# Patient Record
Sex: Male | Born: 1990 | Race: Black or African American | Hispanic: No | Marital: Single | Smoking: Current every day smoker
Health system: Southern US, Community
[De-identification: ages and names within clinical notes are randomized; demographics above are authoritative.]

## PROBLEM LIST (undated history)

## (undated) DIAGNOSIS — F319 Bipolar disorder, unspecified: Secondary | ICD-10-CM

## (undated) DIAGNOSIS — F209 Schizophrenia, unspecified: Secondary | ICD-10-CM

---
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
## 2011-03-25 NOTE — Discharge Summary (Signed)
Alexander Kidd, Kidd NO.:  [id number]   MEDICAL RECORD NO.:  [id number]                   PATIENT TYPE:  INP   LOCATION:  0601                                 FACILITY:  [facility]   PHYSICIAN:  Alexander Milch, MD                  DATE OF BIRTH:  [birth year]   DATE OF ADMISSION:  11/20/2003  DATE OF DISCHARGE:  11/25/2003                                 DISCHARGE SUMMARY   IDENTIFICATION:  A [age]-year-old male, 6th grade student at [employer] was admitted involuntarily on an emergency mental health  petition by [facility] for inpatient stabilization of suicide risk,  depression, and discontrolled behavior.  The patient was referred as well by  [facility], [facility].   SYNOPSIS OF PRESENT ILLNESS:  The patient has no previous mental health care  but has a 1-2 year history of cumulative dissatisfaction and disappointment  with himself, his life and his future, but particularly his family.  The  patient has wanted the parents to reunite after 7 years of separation.  He  continues to see father every weekend and mother is overwhelmed with the  patient's aggressive despair.  The parents were domestically violent toward  each other, with father more than mother, and father has had substance abuse  with alcohol according to mother.  The patient apparently had to live with  paternal grandmother for 4 years to attend the best school and she now has  colon cancer and has had a heart attack.  Maternal grandmother died 2 years  ago of throat cancer.  The patient is perfectionistic in his expectations of  self and others and wants to a professional football player.  He describes  identity diffusion and confusion, particularly relative to identifying with  father, but disapproving of parents' separation.  The patient has  orthodontic braces.  He cannot play football again until next fall.  Mother  considers the patient is  failing at school and has a Advertising account planner.  A  peer had blamed the patient for something that resulted in in-school  suspension as the trigger for his current decompensation with the patient  planning to kill himself and having 2 occasions of suicide attempt in the  last year, one running in front of a moving vehicle and the other cutting  himself.  He wrote a note, drew a picture and stated he was going to hang  himself at this time and would run away to do so.   INITIAL MENTAL STATUS EXAM:  The patient was significantly obsessive and  narcissistically defended.  He had atypical depressive symptoms and  features, with dysthymic dissatisfaction and disappointment and lack of  fulfillment in his life, himself and his future.  He had no psychotic or  manic symptoms.  He had no definite anxiety but had significant impulse  control difficulties.  He was moderately  avoidant and inattentive initially,  with fidgeting hyperactivity, though both of these may be associated with  his agitation and depression.  No definite BHD was present clinically.   LABORATORY FINDINGS:  CBC on admission was normal, except white count low at  4100, reference range 4800-12,000, and MCV was borderline low at 77.6 with  reference range 78-92.  Hemoglobin was normal at 12.5 and platelet count  267,000.  Basic metabolic panel was normal with sodium 136, potassium 4.3,  glucose 85, creatinine 0.8, and calcium 10.1.  Hepatic function panel was  normal with total bilirubin 0.7, AST 22, ALT 21, and albumin 3.6 with GGT  normal at 9.  Free T4 was borderline low at 0.88 with reference range 0.89-  1.8 and TSH was normal at 1.852.  Urinalysis was normal with specific  gravity of 1.027.   Kidd COURSE AND TREATMENT:  General medical exam by [facility],  PA-C noted no allergies and the patient reported home was great and he had  friends and school had been good.  He had well-healed scars on the left  forearm  from football.  He was considered possibly overweight and his  admission height was 64.75 inches with weight of 164 pounds.  Blood pressure  was 154/77 with heart rate of 93 sitting on admission and standing blood  pressure 124/70 with heart rate of 112.  His final weight in the Kidd  the day before discharge was 160 pounds and his final blood pressure was  101/55 with heart rate of 75 supine and standing blood pressure was 108/59  with heart rate of 107.  The patient's symptoms was initially denied and  clinically repressed and suppressed by the patient until on the second  Kidd day he was stating he could not stand it anymore.  He had a  difficult time processing and identifying problems and then problem solving.  It was necessary to progressively work through these symptoms and the  obstacles to therapeutic change and recovery.  Dysphoria was most important  to contain in order to facilitate the patient's ability to work through  these symptoms in a stepwise fashion.  Impulse control difficulties were  clearly present in this process and satiation in working through for habit  reversal was carried out multiple times during the Kidd stay.  The  patient did not manifest other habits or rituals.  He did not have  perseveration or stereotypies.  Effexor became important toward mobilizing  the patient's improved capacity to work on impulse control and anger  outbursts, as well as cumulative despair and perfectionistic dysphoria,  without continually regressing into rageful self destruction and acting out.  The patient started Effexor XR at 37.5 mg daily and advanced to 75 mg XR  daily.  He required one p.r.n. dose of Vistaril 50 mg during his Kidd  stay.  The patient started to make progress, particularly with the family  therapy session the day before discharge.  He had another family therapy  session on the afternoon of discharge and was capable along with parents  of establishing capacity for transition of care to outpatient treatment and  patient's return home.  He was discharged in improved condition though with  need for ongoing treatment.  However his medication was stable and he and  family were communicating well and working diligently upon more safe and  successful problem solving, impulse control and anger dissipation.  The  patient indicated by the time of discharge that he was accepting  that his  parents were never going to reunite.  He was motivated for outpatient  treatment as were both parents, and he was discharged in improved condition.  He had no suicide-related side effects by the time of discharge and no other  side effects from Effexor, including no gastroesophageal side effects.   FINAL DIAGNOSES:  AXIS 1:  1. Dysthymic disorder, early onset, severe, with atypical features.  2. 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  3. Rule out impulse control disorder not otherwise specified (provisional     diagnosis).  4. Parent-child problem.  5. Other interpersonal problem.  6. Other specified family circumstances.  AXIS II:  Rule out learning disorder not otherwise specified (provisional diagnosis).  AXIS III:  1. Orthodontic braces.  2. Borderline microcytosis and borderline leukopenia probably nutritional.  3. Borderline low Free T4 with normal TSH, likely physiologic stress.  4. Borderline overweight likely muscular.  AXIS IV:  Stressors:  Family - severe, acute and chronic; school - moderate, acute and  chronic.  AXIS V:  Global assessment of function on admission 35 with highest in the last year  74 and discharge global assessment of function was 57.   PLAN:  The patient manifested a quick stabilization and worked through his  obstacles to family problem solving to establish the capacity to transition  to outpatient treatment.  He is prescribed Effexor XR 75 mg every morning,  quantity #30 with 1 refill  and he and family are educated on the side  effects, risks, and proper use, as well as FDA guidelines on monitoring for  the 1/50 chance suicide-related side effects might occur in a person less  than age 78  taking any antidepressant for any reason.  The patient will be seen at  [facility], with an appointment scheduled with Alexander K[facility] for November 27, 2003 at 0830.  Crisis and safety plans established if  needed.                                               Alexander Milch, MD    GJ/MEDQ  D:  11/25/2003  T:  11/26/2003  Job:  [id number]   cc:   [facility]  [address]  [place], [place] [address]  Fax:  [phone]

## 2011-03-25 NOTE — H&P (Signed)
Alexander Kidd, LUCKEN NO.:  [id number]   MEDICAL RECORD NO.:  [id number]                   PATIENT TYPE:  INP   LOCATION:  0601                                 FACILITY:  [facility]   PHYSICIAN:  Beverly Milch, MD                  DATE OF BIRTH:  [birth year]   DATE OF ADMISSION:  11/20/2003  DATE OF DISCHARGE:                         PSYCHIATRIC ADMISSION ASSESSMENT   IDENTIFICATION:  A 20 year old male, 6th grade student at [employer], is admitted involuntarily on an emergency mental health  petition by [facility] for inpatient stabilization of suicide risk,  depression and angry, discontrolled behavior.  Mother is afraid the patient  will kill himself and the patient is referred by Tretha Sciara, [phone] or  [phone], with [facility].   HISTORY OF PRESENT ILLNESS:  The patient reports that he has had no previous  mental health treatment, including no inpatient care.  He is on no  medications and has had no previous psychotropic medications.  The patient  minimizes his symptoms in a defensive fashion but will gradually start to  open up and state that he had a lot building for a long time and just lost  control of it as it all came out at once.  However, he required assistance  in structuring the communication of his inter psychic feelings and thoughts,  rather than being able to spontaneously do so himself.  Mother is concerned  that the patient has attempted to harm himself or kill himself in the past  on at least 2 occasions over the last year, including cutting himself and  running out in front of a moving vehicle once.  The patient now has written  a note, drawn a picture and stated he was going to hang himself.  He  indicated he would run away in order to do so successfully.  The school and  mother have been significantly concerned in that regard.  The patient denies  that he has any depression.  He will  acknowledge that he is somewhat  perfectionistic in regard to his expectations of family, school, and self  and describes himself as having a self centered attitude like his father.  Parents separated apparently 7 years ago and the patient sees father every  weekend.  Mother is overwhelmed attempting to manage the patient's behavior  and emotions now.  Apparently parents were domestically violent toward each  other, with father being more so than mother.  Father had problems with  substance abuse with alcohol according to mother.  The patient apparently  had to live with paternal grandmother for 4 years to attend the best school.  Paternal grandmother now has colon cancer and has had a heart attack, and  maternal grandmother died 2 years ago of throat cancer.  The patient is  hypersensitive to the comments or actions of others and seems to have  significant rejection sensitivity.  He therefore presents atypical  depressive feathers that mother suggests have lasted for a full year but the  patient suggests are not present.  The patient suggests that he just has  some anger and some build up of negative emotion which he considers  appropriate for the course of events he has experienced in life.  Father  tends to be devaluing of others and the patient exhibits the same, according  to the patient and mother.  The patient is having conflicts and difficulties  at school according to mother.  The patient received in-school suspension  and thinks this was unfair as he was being blamed by another peer at school  for something he did not do according to the patient.  Mother notes that the  patient is essentially failing at school now and may be in a resource room.  He is having difficulty at school in several levels in that regard.  They do  not know of any definite learning disability or attention deficit  hyperactivity disorder.  The patient is resistant to consideration or even  discussion of  medications and will need help reviewing such, particularly as  mother is feeling that there is an emergent need to do something to help him  and he is not the best candidate for therapy currently.  The patient denies  the use of alcohol, illicit drugs, or tobacco.  He denies that he has had  any legal infractions and he does not meet criteria for definite  oppositional-defiant disorder though such may be evolving.  He describes  identity diffusion and confusion, particularly relative to parents'  separation and the patient's identification with father but living with  mother.   PAST MEDICAL HISTORY:  The patient has measles and chickenpox in early  childhood.  He had a PPD test 2 years ago.  He has apparently been under the  care of Dr. Penelope Galas in the recent past.  He had orthodontic braces.  He has  no medication allergies and is on no medications.  He has no seizure or  syncope.  He has had no heart murmur or arrythmia.  He has had no organic  central nervous system trauma.  Immunizations are up to date.   FAMILY HISTORY:  Father had substance abuse with alcohol and both parents  have been domestically abusive, father more so than mother.  Maternal  grandmother died of cancer of the throat 2 years ago.  Paternal grandmother  now has colon cancer and had a coronary event.  The patient's mother and  sister have asthma.  The patient has 2 sisters and one half sister.   SOCIAL AND DEVELOPMENTAL HISTORY:  The patient is in the 6th grade  currently, but mother states he is failing at [employer].  He  may have a Advertising account planner.  A peer apparently blamed him, which the  patient considered unfair and inappropriate and the patient now has in-  school suspension.  The patient responded to in-school suspension in a  perfectionistic and narcissistic fashion by planning to hang himself.  The  patient states he just lost control of his emotions that had been building up in a  negative way for a long time.   ASSETS:  The patient is intelligent and driven but unsuccessfully so.   MENTAL STATUS EXAM:  Height is 63.75 inches and weight is 164 pounds.  Blood  pressure is 154/77 with heart rate of 93 supine and standing blood  pressure  is 124/70 with heart rate of 112.  The patient is right handed.  Neurological exam is intact.  He has no pathological reflexes or soft  neurologic findings.  He has no abnormal involuntary movements.  Gait and  gaze are intact.  Cranial nerves intact.  Muscle strength and tone are  normal.  He is alert and oriented with speech intact.  AMRs and DTRs are  intact.  The patient is highly obsessive and narcissistically defended.  He  will not acknowledge his negative affect an atypical depressive symptoms  until these are formulated and displayed for him.  He has cumulative inter  psychic dissatisfaction, disappointment and lack of fulfillment with  himself, his life and his future, but even more so with his family.  He  apparently still has a need for parents to be reunited and father to be in  his life more frequently.  He has no definite anxiety though he does have  impulse control difficulties.  He seems moderately inattentive or avoidant  at this time.  He has fidgeting hyperactivity but this may be associated  with his agitation and his mood and his cumulative anger.  He does not  manifest psychosis or mania.  He does have suicidal ideation and has  written, drawn, and described this to others.   IMPRESSION:  AXIS 1:  1. Depressive disorder not otherwise specified with atypical features, most     consistent with dysthymic disorder.  2. 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  3. Rule out attention deficit hyperactivity disorder (provisional     diagnosis).  4. Parent-child problem.  5. Other interpersonal problem.  6. Other specified family circumstances.  AXIS II:  Rule out learning disorder not otherwise  specified (provisional diagnosis).  AXIS III:  Orthodontic braces.  AXIS IV:  Stressors:  Family - severe, acute and chronic; school - moderate, acute and  chronic.  AXIS V:  Global assessment of function on admission 35 with highest in last year 74.   PLAN:  The patient is admitted for inpatient adolescent psychiatric and  multi-disciplinary, multi-modal behavioral health treatment in a team-based  program in a locked psychiatric unit.  Anger management, cognitive  behavioral therapy, and family therapy are planned.  Mobilization of  cumulative stressors for establishing the capacity of coping with these in  an effective, sequential way instead of having them build up to the point of  decompensation is planned.  Effexor is recommended if willing but the  patient is unwilling to consider pharmacotherapy at this time.  Teaching  will be provided for him and parents in case he becomes willing or in case  family can help him with this decision, especially mother. Estimated length of stay is 5-7 days with  target symptoms for discharge being stabilization of suicide risk and mood,  stabilization of anger and impulse control and  generalization of the  capacity for safe and effective consolidation of identity and participation  in outpatient treatment in a compliant fashion.                                               Beverly Milch, MD    GJ/MEDQ  D:  11/21/2003  T:  11/21/2003  Job:  [id number]

## 2011-08-13 ENCOUNTER — Emergency Department (HOSPITAL_COMMUNITY)
Admission: EM | Admit: 2011-08-13 | Discharge: 2011-08-13 | Discharge status: Against Medical Advice | Payer: Self-pay | Attending: Emergency Medicine | Admitting: Emergency Medicine

## 2011-08-13 DIAGNOSIS — R51 Headache: Secondary | ICD-10-CM | POA: Insufficient documentation

## 2011-08-13 DIAGNOSIS — R509 Fever, unspecified: Secondary | ICD-10-CM | POA: Insufficient documentation

## 2011-08-13 DIAGNOSIS — J351 Hypertrophy of tonsils: Secondary | ICD-10-CM | POA: Insufficient documentation

## 2011-08-13 DIAGNOSIS — J029 Acute pharyngitis, unspecified: Secondary | ICD-10-CM | POA: Insufficient documentation

## 2011-08-13 DIAGNOSIS — H5710 Ocular pain, unspecified eye: Secondary | ICD-10-CM | POA: Insufficient documentation

## 2011-08-13 LAB — RAPID STREP SCREEN (MED CTR MEBANE ONLY): Streptococcus, Group A Screen (Direct): POSITIVE — AB

## 2011-11-18 ENCOUNTER — Other Ambulatory Visit: Payer: Self-pay

## 2011-11-18 ENCOUNTER — Encounter (HOSPITAL_COMMUNITY): Payer: Self-pay

## 2011-11-18 ENCOUNTER — Emergency Department (HOSPITAL_COMMUNITY)
Admission: EM | Admit: 2011-11-18 | Discharge: 2011-11-18 | Discharge status: Against Medical Advice | Payer: Self-pay | Attending: Emergency Medicine | Admitting: Emergency Medicine

## 2011-11-18 ENCOUNTER — Emergency Department (HOSPITAL_COMMUNITY): Payer: Self-pay

## 2011-11-18 DIAGNOSIS — R059 Cough, unspecified: Secondary | ICD-10-CM | POA: Insufficient documentation

## 2011-11-18 DIAGNOSIS — F17200 Nicotine dependence, unspecified, uncomplicated: Secondary | ICD-10-CM | POA: Insufficient documentation

## 2011-11-18 DIAGNOSIS — R197 Diarrhea, unspecified: Secondary | ICD-10-CM | POA: Insufficient documentation

## 2011-11-18 DIAGNOSIS — R509 Fever, unspecified: Secondary | ICD-10-CM | POA: Insufficient documentation

## 2011-11-18 DIAGNOSIS — R05 Cough: Secondary | ICD-10-CM | POA: Insufficient documentation

## 2011-11-18 DIAGNOSIS — F319 Bipolar disorder, unspecified: Secondary | ICD-10-CM | POA: Insufficient documentation

## 2011-11-18 DIAGNOSIS — Z8659 Personal history of other mental and behavioral disorders: Secondary | ICD-10-CM | POA: Insufficient documentation

## 2011-11-18 DIAGNOSIS — R51 Headache: Secondary | ICD-10-CM | POA: Insufficient documentation

## 2011-11-18 HISTORY — DX: Bipolar disorder, unspecified: F31.9

## 2011-11-18 HISTORY — DX: Schizophrenia, unspecified: F20.9

## 2011-11-18 LAB — POCT I-STAT, CHEM 8
BUN: 11 mg/dL (ref 6–23)
Calcium, Ion: 1.14 mmol/L (ref 1.12–1.32)
TCO2: 26 mmol/L (ref 0–100)

## 2011-11-18 LAB — POCT I-STAT TROPONIN I: Troponin i, poc: 0.01 ng/mL (ref 0.00–0.08)

## 2011-11-18 MED ORDER — DIPHENHYDRAMINE HCL 50 MG/ML IJ SOLN
25.0000 mg | Freq: Once | INTRAMUSCULAR | Status: AC
  Administered 2011-11-18: 25 mg via INTRAVENOUS
  Filled 2011-11-18: qty 1

## 2011-11-18 MED ORDER — DEXAMETHASONE SODIUM PHOSPHATE 10 MG/ML IJ SOLN
10.0000 mg | Freq: Once | INTRAMUSCULAR | Status: AC
  Administered 2011-11-18: 10 mg via INTRAVENOUS
  Filled 2011-11-18: qty 1

## 2011-11-18 MED ORDER — METOCLOPRAMIDE HCL 5 MG/ML IJ SOLN
10.0000 mg | Freq: Once | INTRAMUSCULAR | Status: AC
  Administered 2011-11-18: 10 mg via INTRAVENOUS
  Filled 2011-11-18: qty 2

## 2011-11-18 MED ORDER — SODIUM CHLORIDE 0.9 % IV BOLUS (SEPSIS)
1000.0000 mL | Freq: Once | INTRAVENOUS | Status: AC
  Administered 2011-11-18: 1000 mL via INTRAVENOUS

## 2011-11-18 MED ORDER — KETOROLAC TROMETHAMINE 30 MG/ML IJ SOLN
30.0000 mg | Freq: Once | INTRAMUSCULAR | Status: AC
  Administered 2011-11-18: 30 mg via INTRAVENOUS
  Filled 2011-11-18: qty 1

## 2011-11-18 NOTE — ED Notes (Signed)
Explained to patient the anticipated time for blood results to be resulted. Patient states "I'm ready to go. I been here too damn long. Take this IV out." Schorr, FNP notified.

## 2011-11-18 NOTE — ED Notes (Signed)
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  Has vague sx's x last 6 months; "feels like heart will pop out of chest, shob, h/a"  Pt's fiance states she was lying on his chest about a month ago listening to his heart beat stating that it stopped beating x a few seconds, thumped his chest and he woke up.  Pt says when he lies on his LT side that he is shob.  Also c/o LT hand pain x few months.

## 2011-11-18 NOTE — ED Notes (Signed)
Bed:WA15<BR> Expected date:<BR> Expected time:<BR> Means of arrival:<BR> Comments:<BR> Triage 1

## 2011-11-18 NOTE — ED Notes (Signed)
Patient transported to X-ray 

## 2011-11-18 NOTE — ED Notes (Signed)
Patient talking loudly in the room. Left ED treatment area ambulatory. Significant other remains at bedside awaiting results.

## 2011-11-19 NOTE — ED Provider Notes (Signed)
Medical screening examination/treatment/procedure(s) were performed by non-physician practitioner and as supervising physician I was immediately available for consultation/collaboration.   Nat Christen, MD 11/19/11 1345

## 2011-11-19 NOTE — ED Provider Notes (Signed)
History     CSN: [id number]  Arrival date & time 11/18/11  2044   First MD Initiated Contact with Patient 11/18/11 2137      Chief Complaint  Patient presents with  . Cough  . Fever  . Headache     Patient is a 21 y.o. male presenting with flu symptoms.  Influenza This is a new problem. The current episode started in the past 7 days. Associated symptoms include a change in bowel habit, chest pain, chills, congestion, headaches and myalgias. Pertinent negatives include no abdominal pain, coughing, neck pain, vomiting or weakness. The symptoms are aggravated by sneezing. He has tried nothing for the symptoms.  Pt reports multiple somewhat vague complaints. States he has had (L) rib area pain x 6 months w/ intermittent SOB and CP for same period of time. Denies injury. More recently pt reports 2-3 day h/o diarrhea (2 -3 episodes a day), sneezing, bodyaches, and chills. States symptoms have been associated w/ a severe headache.  Past Medical History  Diagnosis Date  . Bipolar disorder   . Schizophrenia     History reviewed. No pertinent past surgical history.  History reviewed. No pertinent family history.  History  Substance Use Topics  . Smoking status: Current Everyday Smoker -- 1.0 packs/day  . Smokeless tobacco: Not on file  . Alcohol Use: Yes     occasionally      Review of Systems  Constitutional: Positive for chills.  HENT: Positive for congestion. Negative for neck pain.   Eyes: Negative.   Respiratory: Negative.  Negative for cough.   Cardiovascular: Positive for chest pain.  Gastrointestinal: Positive for change in bowel habit. Negative for vomiting and abdominal pain.  Genitourinary: Negative.   Musculoskeletal: Positive for myalgias.  Skin: Negative.   Neurological: Positive for headaches. Negative for weakness.  Hematological: Negative.   Psychiatric/Behavioral: Negative.     Allergies  Review of patient's allergies indicates no known  allergies.  Home Medications   Current Outpatient Rx  Name Route Sig Dispense Refill  . ACETAMINOPHEN 500 MG PO TABS Oral Take 1,500 mg by mouth every 6 (six) hours as needed. pain    . NYQUIL PO Oral Take 15 mLs by mouth at bedtime.      BP 131/57  Pulse 78  Temp(Src) 98.3 F (36.8 C) (Oral)  Resp 18  Ht 6\' 2"  (1.88 m)  Wt 300 lb (136.079 kg)  BMI 38.52 kg/m2  SpO2 98%  Physical Exam  Constitutional: He is oriented to person, place, and time. He appears well-developed and well-nourished.  HENT:  Head: Normocephalic and atraumatic.  Eyes: Conjunctivae are normal.  Neck: Neck supple.  Cardiovascular: Normal rate and regular rhythm.  Exam reveals no gallop and no friction rub.   No murmur heard. Pulmonary/Chest: Effort normal and breath sounds normal.  Abdominal: Soft. Bowel sounds are normal.  Musculoskeletal: Normal range of motion.  Neurological: He is oriented to person, place, and time.  Skin: Skin is warm and dry.  Psychiatric: He is agitated.    ED Course  Procedures   Date: 11/19/2011  Rate: 79  Rhythm: normal sinus rhythm  QRS Axis: normal  Intervals: normal  ST/T Wave abnormalities: nonspecific T wave changes  Conduction Disutrbances:none  Narrative Interpretation: Right axis deviation  Old EKG Reviewed: none available 2200:  Pt w/ multiple vague c/o's of chronic type chest wall pain w/ intermittent SOB x 6 mo. Currently w/ 3 to 4 days of viral type symptoms that have been  associated a severe headache. EKG w/o acute findings. Will initiate IVF's, obtain appropriate lab studies, medicate for headache and re-evaluate.  2300:  RN has notified me that pt has become more agitated. States pt reports he does like the way the medication makes him feel and is attempting to tear out his IV. I went to see pt to explain that symptoms associated w/ the medications would resolve.Pt is verbally abusive, very agitated and states he is ready to go. Friend present is trying to  encourage pt to stay for results of tests and pt states he will stay.   2315: RN informs me that pt has walked out of the ED, yelling and arguing w/ male friend/ [employer] officer was ask to see pt and friends out of the dept.  Labs Reviewed  POCT I-STAT, CHEM 8 - Abnormal; Notable for the following:    Glucose, Bld 114 (*)    All other components within normal limits  POCT I-STAT TROPONIN I  I-STAT TROPONIN I  I-STAT, CHEM 8   Dg Chest 2 View  11/18/2011  *RADIOLOGY REPORT*  Clinical Data: Shortness of breath, cough and chills.  CHEST - 2 VIEW  Comparison: Chest radiograph performed 05/13/2004  Findings: The lungs are well-aerated and clear.  There is no evidence of focal opacification, pleural effusion or pneumothorax.  The heart is normal in size; the mediastinal contour is within normal limits.  No acute osseous abnormalities are seen.  IMPRESSION: No acute cardiopulmonary process seen.  Original Report Authenticated By: Tonia Ghent, M.D.     No diagnosis found.    MDM  Though there were no acute findings prior to pt's elopement, evaluation incomplete.         Roma Kayser Zakhai Meisinger, NP 11/19/11 [phone]

## 2012-04-06 ENCOUNTER — Emergency Department (HOSPITAL_COMMUNITY)
Admission: EM | Admit: 2012-04-06 | Discharge: 2012-04-06 | Disposition: A | Discharge status: Home / Self Care | Payer: Self-pay | Attending: Emergency Medicine | Admitting: Emergency Medicine

## 2012-04-06 ENCOUNTER — Encounter (HOSPITAL_COMMUNITY): Payer: Self-pay | Admitting: *Deleted

## 2012-04-06 ENCOUNTER — Emergency Department (HOSPITAL_COMMUNITY): Payer: Self-pay

## 2012-04-06 DIAGNOSIS — F17200 Nicotine dependence, unspecified, uncomplicated: Secondary | ICD-10-CM | POA: Insufficient documentation

## 2012-04-06 DIAGNOSIS — J36 Peritonsillar abscess: Secondary | ICD-10-CM | POA: Insufficient documentation

## 2012-04-06 DIAGNOSIS — R599 Enlarged lymph nodes, unspecified: Secondary | ICD-10-CM | POA: Insufficient documentation

## 2012-04-06 LAB — RAPID STREP SCREEN (MED CTR MEBANE ONLY): Streptococcus, Group A Screen (Direct): POSITIVE — AB

## 2012-04-06 MED ORDER — AMOXICILLIN-POT CLAVULANATE 875-125 MG PO TABS: 1.0000 | ORAL_TABLET | Freq: Two times a day (BID) | ORAL | Status: DC

## 2012-04-06 MED ORDER — MORPHINE SULFATE 4 MG/ML IJ SOLN
4.0000 mg | Freq: Once | INTRAMUSCULAR | Status: AC
  Administered 2012-04-06: 4 mg via INTRAVENOUS
  Filled 2012-04-06: qty 1

## 2012-04-06 MED ORDER — BUTAMBEN-TETRACAINE-BENZOCAINE 2-2-14 % EX AERO
INHALATION_SPRAY | CUTANEOUS | Status: AC
  Filled 2012-04-06: qty 56

## 2012-04-06 MED ORDER — AMOXICILLIN-POT CLAVULANATE 875-125 MG PO TABS: 1.0000 | ORAL_TABLET | Freq: Two times a day (BID) | ORAL | Status: AC

## 2012-04-06 MED ORDER — MORPHINE SULFATE 4 MG/ML IJ SOLN
INTRAMUSCULAR | Status: AC
  Administered 2012-04-06: 4 mg
  Filled 2012-04-06: qty 1

## 2012-04-06 MED ORDER — IOHEXOL 300 MG/ML  SOLN
100.0000 mL | Freq: Once | INTRAMUSCULAR | Status: AC | PRN
  Administered 2012-04-06: 100 mL via INTRAVENOUS

## 2012-04-06 MED ORDER — ALBUTEROL SULFATE HFA 108 (90 BASE) MCG/ACT IN AERS
2.0000 | INHALATION_SPRAY | RESPIRATORY_TRACT | Status: DC | PRN
  Filled 2012-04-06: qty 6.7

## 2012-04-06 MED ORDER — ONDANSETRON HCL 4 MG/2ML IJ SOLN
4.0000 mg | Freq: Once | INTRAMUSCULAR | Status: AC
  Administered 2012-04-06: 4 mg via INTRAVENOUS
  Filled 2012-04-06: qty 2

## 2012-04-06 MED ORDER — HYDROCODONE-ACETAMINOPHEN 7.5-500 MG/15ML PO SOLN: 15.0000 mL | Freq: Four times a day (QID) | ORAL | Status: AC | PRN

## 2012-04-06 MED ORDER — CLINDAMYCIN PHOSPHATE 900 MG/50ML IV SOLN
900.0000 mg | Freq: Once | INTRAVENOUS | Status: AC
  Administered 2012-04-06: 900 mg via INTRAVENOUS
  Filled 2012-04-06: qty 50

## 2012-04-06 NOTE — Progress Notes (Signed)
ED Cm contacted by ED staff to assist with medications. Pt is eligible per [facility] in [place] [facility] for [facility] indigent medication assistance for Augmentin CM updated the ED RN

## 2012-04-06 NOTE — Progress Notes (Signed)
Pt listed as self pay with no insurance coverage Pt confirms he is self pay [place] resident CM and [facility] community liaison spoke with him Pt offered [facility] services to assist with finding a [place] self pay provider pt previously had medicaid

## 2012-04-06 NOTE — Progress Notes (Signed)
Cm spoke with pt who confirms no pcp [place] resident.  Cm reviewed self pay pcps in [place] ([facility], [facility], [facility]), [email or web address] discounted pharmacies and [facility].  Pt voiced understanding and appreciation of services offered.  Provided other [place] resources.  Pt's girlfriend and the girlfriend's mother very interactive and supportive of pt They both voiced understanding of resources offered. Cm also offered information on IRC and DMV for ID replacement.

## 2012-04-06 NOTE — Consult Note (Signed)
21 year old who's had problems with left sore throat for 3 days. He does not have a chronic tonsillitis issue. He's not had a previous peritonsillar abscess. CT scan indicated a 2 cm left peritonsillar abscess.  Examination-awake and alert. Nose is clear. Oral cavity/oropharynx-both tonsils have exudate and the left tonsil has more swelling and some peritonsillar edema. Uvula is slightly edematous. Tongue is normal. He has no trismus. Neck without significant mass or adenopathy  Left peritonsillar abscess-we discussed options and he would like to proceed with incision and drainage. We discussed the procedure. We discussed risks, benefits, and options and all questions are answered and consent was obtained. These mouth was sprayed with Cetacaine and then injected with 1% lidocaine with 1 100,000 epinephrine in the left peritonsillar region. A 15 blade was used to open the peritonsillar region and a tonsil hemostat to open the peritonsillar space. There was small amount of purulent material expressed. He tolerated the procedure well. There was good hemostasis. We placed on liquid Augmentin and a Lortab elixir. He is to followup in the office in one week sooner if he is not almost back to normal within 72 hours.

## 2012-04-06 NOTE — ED Provider Notes (Signed)
Medical screening examination/treatment/procedure(s) were conducted as a shared visit with non-physician practitioner(s) and myself.  I personally evaluated the patient during the encounter  Cheri Guppy, MD 04/06/12 [phone]

## 2012-04-06 NOTE — Progress Notes (Signed)
Pt reports recent release from prison

## 2012-04-06 NOTE — ED Notes (Signed)
Pt c/o sore throat and pain with swallowing x's 2 days. Reports fever at home.

## 2012-04-06 NOTE — Discharge Instructions (Signed)
Peritonsillar Abscess  A peritonsillar abscess is a collection of pus located in the back of the throat behind the tonsils. It usually occurs when a streptococcal infection of the throat or tonsils spreads into the space around the tonsils. They are almost always caused by the streptococcal germ (bacteria). The treatment of a peritonsillar abscess is most often drainage accomplished by putting a needle into the abscess or cutting (incising) and draining the abscess. This is most often followed with a course of antibiotics.  HOME CARE INSTRUCTIONS   If your abscess was drained by your caregiver today, rinse your throat (gargle) with warm salt water four times per day or as needed for comfort. Do not swallow this mixture. Mix 1 teaspoon of salt in 8 ounces of warm water for gargling.   Rest in bed as needed. Resume activities as able.   Apply cold to your neck for pain relief. Fill a plastic bag with ice and wrap it in a towel. Hold the ice on your neck for 20 minutes 4 times per day.   Eat a soft or liquid diet as tolerated while your throat remains sore. Popsicles and ice cream may be good early choices. Drinking plenty of cold fluids will probably be soothing and help take swelling down in between the warm gargles.   Only take over-the-counter or prescription medicines for pain, discomfort, or fever as directed by your caregiver. Do not use aspirin unless directed by your physician. Aspirin slows down the clotting process. It can also cause bleeding from the drainage area if this was needled or incised today.   If antibiotics were prescribed, take them as directed for the full course of the prescription. Even if you feel you are well, you need to take them.  SEEK MEDICAL CARE IF:    You have increased pain, swelling, redness, or drainage in your throat.   You develop signs of infection such as dizziness, headache, lethargy, or generalized feelings of illness.   You have difficulty breathing, swallowing or  eating.   You show signs of becoming dehydrated (lightheadedness when standing, decreased urine output, a fast heart rate, or dry mouth and mucous membranes).  SEEK IMMEDIATE MEDICAL CARE IF:    You have a fever.   You are coughing up or vomiting blood.   You develop more severe throat pain uncontrolled with medicines or you start to drool.   You develop difficulty breathing, talking, or find it easier to breathe while leaning forward.  Document Released: 10/24/2005 Document Revised: 10/13/2011 Document Reviewed: 06/06/2008  ExitCare Patient Information 2012 [employer].

## 2012-04-06 NOTE — ED Notes (Signed)
Pt given Ventolin inhaler. Sent to lobby to wait for meds from pharmacy.

## 2012-04-06 NOTE — ED Provider Notes (Addendum)
History     CSN: [id number]  Arrival date & time 04/06/12  1228   First MD Initiated Contact with Patient 04/06/12 1240      Chief Complaint  Patient presents with  . Sore Throat    (Consider location/radiation/quality/duration/timing/severity/associated sxs/prior treatment) HPI Patient presents emergency Dept. with sore throat, and pain with swallowing for the last 3 days.  Patient has been having fevers over that same time frame.  Patient has not vomiting, abdominal pain, chest pain, shortness of breath, weakness, or dizziness.  Patient, states he has taken Benadryl and Tylenol for his symptoms.  Patient states that his speech is muffled. Past Medical History  Diagnosis Date  . Bipolar disorder   . Schizophrenia     History reviewed. No pertinent past surgical history.  History reviewed. No pertinent family history.  History  Substance Use Topics  . Smoking status: Current Everyday Smoker -- 1.0 packs/day    Types: Cigarettes  . Smokeless tobacco: Not on file  . Alcohol Use: Yes     occasionally      Review of Systems All other systems negative except as documented in the HPI. All pertinent positives and negatives as reviewed in the HPI.  Allergies  Review of patient's allergies indicates no known allergies.  Home Medications   Current Outpatient Rx  Name Route Sig Dispense Refill  . ACETAMINOPHEN 500 MG PO TABS Oral Take 1,500 mg by mouth every 6 (six) hours as needed. pain    . DIPHENHYDRAMINE HCL 25 MG PO TABS Oral Take 25 mg by mouth every 6 (six) hours as needed. Allergies    . ALLERGY DECONGESTANT PO Oral Take 1 tablet by mouth every 6 (six) hours as needed. Cold symptoms      BP 132/79  Pulse 100  Temp(Src) 99.9 F (37.7 C) (Oral)  Resp 20  Ht 6\' 2"  (1.88 m)  SpO2 98%  Physical Exam  Constitutional: He is oriented to person, place, and time. He appears well-developed and well-nourished. No distress.  HENT:  Head: Normocephalic and atraumatic.  No trismus in the jaw.  Mouth/Throat: Uvula swelling present. Oropharyngeal exudate, posterior oropharyngeal edema and posterior oropharyngeal erythema present.    Neck: Normal range of motion. Neck supple. No rigidity.  Cardiovascular: Normal rate, regular rhythm and normal heart sounds.  Exam reveals no gallop and no friction rub.   No murmur heard. Pulmonary/Chest: Effort normal and breath sounds normal. No respiratory distress.  Lymphadenopathy:       Head (left side): Submandibular and tonsillar adenopathy present.    He has cervical adenopathy.       Left cervical: Superficial cervical adenopathy present.  Neurological: He is alert and oriented to person, place, and time.  Skin: Skin is warm and dry. No rash noted. He is not diaphoretic. No erythema.    ED Course  Procedures (including critical care time)  Labs Reviewed  RAPID STREP SCREEN - Abnormal; Notable for the following:    Streptococcus, Group A Screen (Direct) POSITIVE (*)    All other components within normal limits   End of the exam does not reveal an obvious peritonsillar abscess.  There is some mild fluctuance around the left tonsil and the patient has muffled voice, sore obtaining a CT scan to look for any abscess that may be present. The patient is maintaining his airway without difficulty.    3:45 PM  explained to the patient was the results of the CT scan and what will be the likely  course.  The mother seems irritated that we have not heard back from ENT since this is the emergency room.  MDM   MDM Reviewed: nursing note and vitals Interpretation: CT scan and labs     Dr. Jearld Fenton will be in to see the patient and drain this abscess.    Carlyle Dolly, PA-C 04/06/12 1459  Carlyle Dolly, PA-C 04/06/12 1622  Carlyle Dolly, PA-C 04/06/12 1622

## 2012-04-06 NOTE — ED Provider Notes (Signed)
Medical screening examination/treatment/procedure(s) were conducted as a shared visit with non-physician practitioner(s) and myself.  I personally evaluated the patient during the encounter 2 d hx of st and fever. No cough.  PE uncomfortable.  + lymphadenopathy, "hot potato" voice.   + left tonsillar exudate, + trismus will get ct to look for PTA.  Cheri Guppy, MD 04/06/12 1341

## 2012-04-07 NOTE — ED Provider Notes (Signed)
Medical screening examination/treatment/procedure(s) were conducted as a shared visit with non-physician practitioner(s) and myself.  I personally evaluated the patient during the encounter  Cheri Guppy, MD 04/07/12 [phone]

## 2012-06-04 ENCOUNTER — Encounter (HOSPITAL_COMMUNITY): Payer: Self-pay | Admitting: Psychiatry

## 2012-06-04 ENCOUNTER — Ambulatory Visit (INDEPENDENT_AMBULATORY_CARE_PROVIDER_SITE_OTHER): Payer: Self-pay | Admitting: Psychiatry

## 2012-06-04 VITALS — BP 102/65 | HR 65 | Wt 295.8 lb

## 2012-06-04 DIAGNOSIS — F988 Other specified behavioral and emotional disorders with onset usually occurring in childhood and adolescence: Secondary | ICD-10-CM

## 2012-06-04 DIAGNOSIS — F302 Manic episode, severe with psychotic symptoms: Secondary | ICD-10-CM

## 2012-06-04 DIAGNOSIS — F1210 Cannabis abuse, uncomplicated: Secondary | ICD-10-CM

## 2012-06-04 DIAGNOSIS — F319 Bipolar disorder, unspecified: Secondary | ICD-10-CM | POA: Insufficient documentation

## 2012-06-04 MED ORDER — LAMOTRIGINE 25 MG PO TABS: ORAL_TABLET | ORAL | Status: DC

## 2012-06-04 MED ORDER — QUETIAPINE FUMARATE 100 MG PO TABS: 100.0000 mg | ORAL_TABLET | Freq: Two times a day (BID) | ORAL | Status: AC

## 2012-06-04 MED ORDER — QUETIAPINE FUMARATE 100 MG PO TABS: 100.0000 mg | ORAL_TABLET | Freq: Every day | ORAL | Status: DC

## 2012-06-04 NOTE — Progress Notes (Signed)
Chief complaint I need help.  I'm not taking my medication since released from prison 2 months ago.  I cannot sleep, I more irritable agitated and having hallucination.  History presenting illness Patient is 21 year old African American single male who came with his cough and and mother of his girlfriend for his appointment.  Patient is seeing in this office first time.  Patient endorse chronic history of psychiatric illness.  He was taking medication in prison however when he release from prison 2 months ago he is out of his medication.  He started to feel paranoid, depression, poor sleep, agitation, irritable mood and hallucination.  He was taking Seroquel, Strattera and Lamictal.  It is unclear why his medication was not continued and given appropriate referral.  Patient endorse when he was taking his medication he was calm and able to function.  He is sleeping only 2-3 hours.  He is getting easily agitated and ready to fight.  He also endorse hallucination which are auditory and visual.  He does not trust anyone.  He denies any active suicidal thinking however endorse passive suicidal thoughts.  Patient is currently homeless and living with his girlfriend who is also staying with her mother.  Patient denies any side effects of these medication.  Current psychiatric medication Patient was given Seroquel, Strattera and Lamictal in prison.  He is not taking any medication at this time.  Past psychiatric history Patient endorsed long history of psychiatric illness started at age 33.  He was diagnosed with ADD .  [facility] has [facility] due to suicidal thinking.  Patient endorsed significant history of anger and agitation.  He also endorse history of psychosis paranoia and depression.  He do not remember the detail office previous psychiatric medication.  He was seeing Dr. Lucianne Muss at mental health.  Patient has a history of suicidal  attempt however endorse history of suicidal thinking.  Legal history Patient has been in jail most of his life in and out.  Most of the charges are drug-related, violation of parole.  Psychosocial history Patient has lived most of his life on street and foster care.  His parents are alive but patient has no contact with them.  He was raised by his grandmother until she died [place]  Patient is very sensitive talking about his parents.  He does not like them.  Patient has no children.  He is in a relationship with his coherent for past 3 years.  Her girlfriend is very supportive.  Patient is applying for Social Security disability.  Education work history Patient did not finish his high school and trying to get GED.  He is currently not working.  Patient is process of applying for Social Security disability.  Alcohol and substance use history Patient endorse history of using cocaine and occasional drinking.  He admitted using marijuana every day to calm himself.  He has a history of intoxication, binge drinking or any withdrawal symptoms.  Medical history Patient has seasonal allergies and is overweight. He has no primary care physician.  His last blood work was done in prison.  Mental status emanation Patient is casually dressed and fairly groomed.  He appears very tense anxious and guarded.  He maintained fair eye contact.  He was given reassurance multiple time by his girlfriend.  He was reluctant to talk about his past.  His speech is slow but clear and  coherent.  His thought processes logical linear and goal-directed.  He endorse paranoia and occasional auditory or visual hallucination however he denies any command auditory hallucination.  He denies any active or passive suicidal thoughts or homicidal thoughts.  His attention and concentration is fair. His fund of knowledge is fair.  He's alert and oriented x3.  There were no tremors or shakes present.  His insight  judgment and impulse control is fair.  Assessment Axis I bipolar disorder with psychotic features, cannabis abuse, ADD by history Axis II deferred Axis III see medical history Axis IV moderate Axis V 50-55  Plan I discussed in detail about her symptoms but the patient, his girlfriend and girlfriend's mother who came with him for this appointment.  We talk about risk of relapse with noncompliance of medication.  We will start Seroquel and Lamictal which he was taking while in prison.  We will start Seroquel 100 mg twice a day and Lamictal 25 mg daily and then gradually go to 50 mg in 2 weeks.  At this time I would not start Strattera however we will consider in the future.  He will need a therapist for coping and social skills.  Patient has significant psychiatric illness.  We talk about risk of relapse with noncompliance of medication and followup.  We will get collateral information from the jail where he has a blood work.  I talk about safety plan that anytime if they have any suicidal thoughts or homicidal thoughts and he need to call 911 or go to local emergency room.  I will see him again in 3 weeks.  Time spent 60 minutes.  Portion of this note is generated with voice dictation software and may contain typographical error.

## 2012-06-20 ENCOUNTER — Ambulatory Visit (HOSPITAL_COMMUNITY): Payer: Self-pay | Admitting: Psychiatry

## 2012-08-24 ENCOUNTER — Encounter (HOSPITAL_COMMUNITY): Payer: Self-pay

## 2012-08-24 ENCOUNTER — Other Ambulatory Visit (HOSPITAL_COMMUNITY): Payer: Self-pay

## 2012-08-27 ENCOUNTER — Telehealth (HOSPITAL_COMMUNITY): Payer: Self-pay

## 2012-08-27 NOTE — Telephone Encounter (Signed)
LATE ENTRY: 08/24/12 Pt's arrived in the office with his girlfriend and the girlfriend's mother didn't have an appointment - stating that they needed a letter showing that patient was in the office on 06/27/12 - after the review of the appointments informed the patient that I could only give a note for the appointment date that he was here and that was on 06/04/2012 - gave the note to the patient.  The patient's girlfriend mother asked if they could get a note for 06/27/12 - explained that the patient didn't have an appointment on 08/21 but the appointment was on 06/20/12 and pt was a no show..... The patient and the patient's girlfriend stated that the patient's parole officer is requesting this information if not then the patient will go back to jail.  The girlfriend started crying stating that he is trying his best to change his life ... He is in school and working 2-jobs and the reason he missed his appt was they didn't have any gas but then the girlfriend and patient stated that the medication Seroquel makes him heavily sedated.  The patient's girlfriend mother stated that if he doesn't get a letter stating that he was here on 06/20/12 then he will go back to jail.   Informed the patient to wait in the lobby while I explained the situation to Dr. Lolly Mustache and it will be his decision.  The situation was explained to Dr Lolly Mustache, per approval a letter was typed... The patient and the girlfriend and girlfriend's mother didn't wait left the office didn't say anything.  The letter was put on the provider's desk for a signature. Also advised that for any reason they can't make his appt to call in advance and we are here  to leave a msg./sh

## 2012-08-27 NOTE — Telephone Encounter (Signed)
8:51am 08/27/12 called and left msg that letter request was sign and ready for pick-up./sh

## 2012-09-04 ENCOUNTER — Encounter (HOSPITAL_COMMUNITY): Payer: Self-pay

## 2012-09-04 ENCOUNTER — Ambulatory Visit (HOSPITAL_COMMUNITY): Payer: Self-pay | Admitting: Psychiatry

## 2016-08-29 ENCOUNTER — Encounter (HOSPITAL_COMMUNITY): Payer: Self-pay | Admitting: Emergency Medicine

## 2016-08-29 ENCOUNTER — Emergency Department (HOSPITAL_COMMUNITY)
Admission: EM | Admit: 2016-08-29 | Discharge: 2016-08-29 | Disposition: A | Discharge status: Home / Self Care | Payer: Self-pay | Attending: Emergency Medicine | Admitting: Emergency Medicine

## 2016-08-29 DIAGNOSIS — J029 Acute pharyngitis, unspecified: Secondary | ICD-10-CM | POA: Insufficient documentation

## 2016-08-29 DIAGNOSIS — F17210 Nicotine dependence, cigarettes, uncomplicated: Secondary | ICD-10-CM | POA: Insufficient documentation

## 2016-08-29 MED ORDER — AMOXICILLIN 500 MG PO CAPS: 500.0000 mg | ORAL_CAPSULE | Freq: Three times a day (TID) | ORAL | 0 refills | Status: DC

## 2016-08-29 MED ORDER — IBUPROFEN 600 MG PO TABS: 600.0000 mg | ORAL_TABLET | Freq: Four times a day (QID) | ORAL | 0 refills | Status: DC

## 2016-08-29 MED ORDER — IBUPROFEN 800 MG PO TABS
800.0000 mg | ORAL_TABLET | Freq: Once | ORAL | Status: AC
  Administered 2016-08-29: 800 mg via ORAL
  Filled 2016-08-29: qty 1

## 2016-08-29 MED ORDER — AMOXICILLIN 250 MG PO CAPS
500.0000 mg | ORAL_CAPSULE | Freq: Once | ORAL | Status: AC
  Administered 2016-08-29: 500 mg via ORAL
  Filled 2016-08-29: qty 2

## 2016-08-29 NOTE — ED Notes (Signed)
Alexander Kidd at bedside 

## 2016-08-29 NOTE — ED Triage Notes (Signed)
Pt reports sore throat x2 days, denies cough. Airway patent.

## 2016-08-29 NOTE — Discharge Instructions (Signed)
Please use salt water gargles three times daily. Use amoxil three times daily and ibuprofen four times daily with meals and at bed time. Wash hands frequently and increase fluids.

## 2016-08-29 NOTE — ED Notes (Signed)
Went to get pt from waiting room and registration notified me that pt had stepped outside.  When going outside to check for pt, no one was outside and no one responded to call.

## 2016-08-29 NOTE — ED Provider Notes (Signed)
[facility]-EMERGENCY DEPT Provider Note   CSN: [id number] Arrival date & time: 08/29/16  0908  By signing my name below, I, Placido Sou, attest that this documentation has been prepared under the direction and in the presence of Ivery Quale, PA-C. Electronically Signed: Placido Sou, ED Scribe. 08/29/16. 9:53 AM.  History   Chief Complaint Chief Complaint  Patient presents with  . Sore Throat    HPI HPI Comments: Alexander Kidd is a 25 y.o. male who is otherwise heathy presents to the Emergency Department complaining of mild left sided throat swelling onset 2 days ago. He describes his symptoms as a "tightness" and reports worsening pain when swallowing. He reports associated subjective fever, diaphoresis, chills, nausea, vomiting and diarrhea. He is averaging 3x vomiting and diarrhea per day. Pt denies rash or other associated symptoms at this time.   The history is provided by the patient. No language interpreter was used.  Sore Throat  This is a new problem. The current episode started 2 days ago. The problem occurs constantly. The problem has not changed since onset.Pertinent negatives include no chest pain, no abdominal pain and no shortness of breath. The symptoms are aggravated by swallowing. Nothing relieves the symptoms. He has tried nothing for the symptoms. The treatment provided no relief.    Past Medical History:  Diagnosis Date  . Bipolar disorder (HCC)   . Schizophrenia [facility])     Patient Active Problem List   Diagnosis Date Noted  . Bipolar 1 disorder (HCC) 06/04/2012    History reviewed. No pertinent surgical history.   Home Medications    Prior to Admission medications   Medication Sig Start Date End Date Taking? Authorizing Provider  acetaminophen (TYLENOL) 500 MG tablet Take 1,500 mg by mouth every 6 (six) hours as needed. pain    Historical Provider, MD  diphenhydrAMINE (BENADRYL) 25 MG tablet Take 25 mg by mouth every 6 (six) hours as needed.  Allergies    Historical Provider, MD  Diphenhydramine-Pseudoephed (ALLERGY DECONGESTANT PO) Take 1 tablet by mouth every 6 (six) hours as needed. Cold symptoms    Historical Provider, MD  lamoTRIgine (LAMICTAL) 25 MG tablet Take 1 tab daily for 1 week and than 2 tab daily 06/04/12   Cleotis Nipper, MD  QUEtiapine (SEROQUEL) 100 MG tablet Take 1 tablet (100 mg total) by mouth 2 (two) times daily. 06/04/12 06/04/13  Cleotis Nipper, MD    Family History Family History  Problem Relation Age of Onset  . Bipolar disorder Father   . Bipolar disorder Sister     Social History Social History  Substance Use Topics  . Smoking status: Current Every Day Smoker    Packs/day: 1.00    Types: Cigarettes  . Smokeless tobacco: Not on file  . Alcohol use No     Comment: occasionally     Allergies   Review of patient's allergies indicates no known allergies.   Review of Systems Review of Systems  Constitutional: Positive for chills, diaphoresis, fatigue and fever. Negative for activity change.       All ROS Neg except as noted in HPI  HENT: Positive for sore throat. Negative for nosebleeds.   Eyes: Negative for photophobia and discharge.  Respiratory: Negative for cough, shortness of breath and wheezing.   Cardiovascular: Negative for chest pain and palpitations.  Gastrointestinal: Positive for diarrhea, nausea and vomiting. Negative for abdominal pain and blood in stool.  Genitourinary: Negative for dysuria, frequency and hematuria.  Musculoskeletal: Negative for arthralgias,  back pain and neck pain.  Skin: Negative.  Negative for color change and wound.  Neurological: Negative for dizziness, seizures and speech difficulty.  Psychiatric/Behavioral: Negative for confusion and hallucinations.     Physical Exam Updated Vital Signs BP 148/74 (BP Location: Right Arm)   Pulse 95   Temp 98.4 F (36.9 C) (Oral)   Resp 14   Ht 6\' 4"  (1.93 m)   Wt 300 lb (136.1 kg)   SpO2 98%   BMI 36.52 kg/m     Physical Exam  Constitutional: He is oriented to person, place, and time. He appears well-developed and well-nourished.  HENT:  Head: Normocephalic and atraumatic.  Exudate and swelling noted to the bilateral tonsils. Airway patent.   Eyes: EOM are normal.  Neck: Normal range of motion.  Swollen lymph nodes noted in the left cervical chain. No nuchal rigidity.   Cardiovascular: Normal rate, regular rhythm, normal heart sounds and intact distal pulses.   No murmur heard. Pulmonary/Chest: Effort normal and breath sounds normal. No respiratory distress. He has no wheezes. He has no rales.  Abdominal: Soft.  Musculoskeletal: Normal range of motion.  Lymphadenopathy:    He has cervical adenopathy (left sided).  Neurological: He is alert and oriented to person, place, and time.  Skin: Skin is warm and dry. No rash noted.  No rash  Psychiatric: He has a normal mood and affect.  Nursing note and vitals reviewed.  ED Treatments / Results  Labs (all labs ordered are listed, but only abnormal results are displayed) Labs Reviewed - No data to display  EKG  EKG Interpretation None       Radiology No results found.  Procedures Procedures  DIAGNOSTIC STUDIES: Oxygen Saturation is 98% on RA, normal by my interpretation.    COORDINATION OF CARE: 9:48 AM Discussed next steps with pt. Pt verbalized understanding and is agreeable with the plan.    Medications Ordered in ED Medications - No data to display   Initial Impression / Assessment and Plan / ED Course  I have reviewed the triage vital signs and the nursing notes.  Pertinent labs & imaging results that were available during my care of the patient were reviewed by me and considered in my medical decision making (see chart for details).  Clinical Course    **I have reviewed nursing notes, vital signs, and all appropriate lab and imaging results for this patient.*  **I personally performed the services described in this  documentation, which was scribed in my presence. The recorded information has been reviewed and is accurate.* Final Clinical Impressions(s) / ED Diagnoses  Exam favors pharyngitis. Pt will be treated with salt water gargles, amoxil, and ibuprofen. Discussed hand washing and increasing fluids.   Final diagnoses:  None    New Prescriptions New Prescriptions   No medications on file     Ivery QualeHobson Tamara Kenyon, PA-C 08/29/16 1043    Nelva Nayobert Beaton, MD 09/03/16 [phone]

## 2016-11-04 ENCOUNTER — Emergency Department (HOSPITAL_COMMUNITY)
Admission: EM | Admit: 2016-11-04 | Discharge: 2016-11-05 | Disposition: A | Discharge status: Home / Self Care | Payer: Self-pay | Attending: Emergency Medicine | Admitting: Emergency Medicine

## 2016-11-04 DIAGNOSIS — S0193XA Puncture wound without foreign body of unspecified part of head, initial encounter: Secondary | ICD-10-CM

## 2016-11-04 DIAGNOSIS — Y929 Unspecified place or not applicable: Secondary | ICD-10-CM | POA: Insufficient documentation

## 2016-11-04 DIAGNOSIS — W3400XA Accidental discharge from unspecified firearms or gun, initial encounter: Secondary | ICD-10-CM | POA: Insufficient documentation

## 2016-11-04 DIAGNOSIS — Y939 Activity, unspecified: Secondary | ICD-10-CM | POA: Insufficient documentation

## 2016-11-04 DIAGNOSIS — S0990XA Unspecified injury of head, initial encounter: Secondary | ICD-10-CM | POA: Insufficient documentation

## 2016-11-04 DIAGNOSIS — Z23 Encounter for immunization: Secondary | ICD-10-CM | POA: Insufficient documentation

## 2016-11-04 DIAGNOSIS — Z79899 Other long term (current) drug therapy: Secondary | ICD-10-CM | POA: Insufficient documentation

## 2016-11-04 DIAGNOSIS — F17210 Nicotine dependence, cigarettes, uncomplicated: Secondary | ICD-10-CM | POA: Insufficient documentation

## 2016-11-04 DIAGNOSIS — S0190XA Unspecified open wound of unspecified part of head, initial encounter: Secondary | ICD-10-CM | POA: Insufficient documentation

## 2016-11-04 DIAGNOSIS — Y999 Unspecified external cause status: Secondary | ICD-10-CM | POA: Insufficient documentation

## 2016-11-04 MED ORDER — TETANUS-DIPHTH-ACELL PERTUSSIS 5-2.5-18.5 LF-MCG/0.5 IM SUSP
0.5000 mL | Freq: Once | INTRAMUSCULAR | Status: AC
  Administered 2016-11-05: 0.5 mL via INTRAMUSCULAR
  Filled 2016-11-04: qty 0.5
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
## 2016-11-05 ENCOUNTER — Encounter (HOSPITAL_COMMUNITY): Payer: Self-pay | Admitting: *Deleted

## 2016-11-05 ENCOUNTER — Emergency Department (HOSPITAL_COMMUNITY): Payer: Self-pay

## 2016-11-05 LAB — TYPE AND SCREEN
BLOOD PRODUCT EXPIRATION DATE: 201801062359
BLOOD PRODUCT EXPIRATION DATE: 201801072359
ISSUE DATE / TIME: 201712292352
ISSUE DATE / TIME: 201712292352
UNIT TYPE AND RH: 9500
Unit Type and Rh: 9500

## 2016-11-05 LAB — I-STAT CHEM 8, ED
BUN: 12 mg/dL (ref 6–20)
CALCIUM ION: 1.06 mmol/L — AB (ref 1.15–1.40)
Chloride: 103 mmol/L (ref 101–111)
Creatinine, Ser: 1.5 mg/dL — ABNORMAL HIGH (ref 0.61–1.24)
Glucose, Bld: 120 mg/dL — ABNORMAL HIGH (ref 65–99)
HEMATOCRIT: 51 % (ref 39.0–52.0)
HEMOGLOBIN: 17.3 g/dL — AB (ref 13.0–17.0)
Potassium: 3.3 mmol/L — ABNORMAL LOW (ref 3.5–5.1)
SODIUM: 140 mmol/L (ref 135–145)
TCO2: 25 mmol/L (ref 0–100)

## 2016-11-05 LAB — I-STAT CG4 LACTIC ACID, ED: Lactic Acid, Venous: 2.6 mmol/L (ref 0.5–1.9)

## 2016-11-05 LAB — PREPARE FRESH FROZEN PLASMA
BLOOD PRODUCT EXPIRATION DATE: 201801032359
Blood Product Expiration Date: 201801142359
ISSUE DATE / TIME: 201712292353
ISSUE DATE / TIME: 201712292353
UNIT TYPE AND RH: 6200
UNIT TYPE AND RH: 6200

## 2016-11-05 LAB — ABO/RH: ABO/RH(D): O POS

## 2016-11-05 NOTE — ED Notes (Signed)
To ct
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
## 2016-11-05 NOTE — Discharge Instructions (Signed)
Keep the wound clean and dry.  Follow up with your family doc in about a week.  Return for redness, drainage, fevers.
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
## 2017-07-05 ENCOUNTER — Encounter (HOSPITAL_COMMUNITY): Payer: Self-pay

## 2017-07-05 ENCOUNTER — Emergency Department (HOSPITAL_COMMUNITY)
Admission: EM | Admit: 2017-07-05 | Discharge: 2017-07-05 | Disposition: A | Discharge status: Home / Self Care | Payer: Self-pay | Attending: Emergency Medicine | Admitting: Emergency Medicine

## 2017-07-05 DIAGNOSIS — R300 Dysuria: Secondary | ICD-10-CM | POA: Insufficient documentation

## 2017-07-05 DIAGNOSIS — F17210 Nicotine dependence, cigarettes, uncomplicated: Secondary | ICD-10-CM | POA: Insufficient documentation

## 2017-07-05 DIAGNOSIS — Z202 Contact with and (suspected) exposure to infections with a predominantly sexual mode of transmission: Secondary | ICD-10-CM | POA: Insufficient documentation

## 2017-07-05 MED ORDER — AZITHROMYCIN 250 MG PO TABS
1000.0000 mg | ORAL_TABLET | Freq: Once | ORAL | Status: AC
  Administered 2017-07-05: 1000 mg via ORAL
  Filled 2017-07-05: qty 4

## 2017-07-05 MED ORDER — CEFTRIAXONE SODIUM 250 MG IJ SOLR
250.0000 mg | Freq: Once | INTRAMUSCULAR | Status: AC
  Administered 2017-07-05: 250 mg via INTRAMUSCULAR
  Filled 2017-07-05: qty 250

## 2017-07-05 MED ORDER — LIDOCAINE HCL (PF) 1 % IJ SOLN
5.0000 mL | Freq: Once | INTRAMUSCULAR | Status: AC
  Administered 2017-07-05: 5 mL
  Filled 2017-07-05: qty 5

## 2017-07-05 NOTE — ED Notes (Signed)
Pt went to restroom after administration of rocephin, lab at bedside, states that he is not in room.  Checked both bathrooms in fast track and no one is present in them.  Link SnufferHobson, PA notified.  Bloodwork was not obtained.

## 2017-07-05 NOTE — ED Triage Notes (Signed)
Pt reports yellow discharge from penis x 2 weeks.  Reports girlfriend was just diagnosed with gonorrhea.

## 2017-07-05 NOTE — Discharge Instructions (Signed)
Please refrain from all sexual activity for the next 7 days. You were treated today with medication for Gonorrhea and Chlamydia coverage. Please see MD at the [facility] Dept if any additional symptoms or problems.

## 2017-07-05 NOTE — ED Notes (Signed)
Hobson, PA at bedside.  

## 2017-07-05 NOTE — ED Provider Notes (Signed)
[facility]-EMERGENCY DEPT Provider Note   CSN: [id number] Arrival date & time: 07/05/17  1434     History   Chief Complaint Chief Complaint  Patient presents with  . S74.5    HPI Alexander Kidd is a 26 y.o. male.  Patient is a 26 year old male who presents to the emergency department with a complaint of urethral discharge.  The patient admits to episodes of unprotected intercourse. He states that over the past 3-3-1/2 weeks he has been having problems with a yellowish discharge and at times some burning with urination. He denies fever or chills. He denies any blood in his urine, negative denies any blood in the semen. He has not had any pain or burning or swelling involving the testicle areas. He has no history of hernia. He has no history of medical conditions that would alter his immune system. He presents at this time requesting evaluation concerning his discharge.      Past Medical History:  Diagnosis Date  . Bipolar disorder (HCC)   . Schizophrenia [facility])     Patient Active Problem List   Diagnosis Date Noted  . Bipolar 1 disorder (HCC) 06/04/2012    History reviewed. No pertinent surgical history.     Home Medications    Prior to Admission medications   Medication Sig Start Date End Date Taking? Authorizing Provider  amoxicillin (AMOXIL) 500 MG capsule Take 1 capsule (500 mg total) by mouth 3 (three) times daily. Patient not taking: Reported on 11/05/2016 08/29/16   Ivery Quale, PA-C  ibuprofen (ADVIL,MOTRIN) 600 MG tablet Take 1 tablet (600 mg total) by mouth 4 (four) times daily. Patient not taking: Reported on 11/05/2016 08/29/16   Ivery Quale, PA-C  lamoTRIgine (LAMICTAL) 25 MG tablet Take 1 tab daily for 1 week and than 2 tab daily Patient not taking: Reported on 11/05/2016 06/04/12   Arfeen, Phillips Grout, MD  QUEtiapine (SEROQUEL) 100 MG tablet Take 1 tablet (100 mg total) by mouth 2 (two) times daily. Patient not taking: Reported on 11/05/2016 06/04/12  11/05/16  Cleotis Nipper, MD    Family History Family History  Problem Relation Age of Onset  . Bipolar disorder Father   . Bipolar disorder Sister     Social History Social History  Substance Use Topics  . Smoking status: Current Every Day Smoker    Packs/day: 1.00    Types: Cigarettes  . Smokeless tobacco: Never Used  . Alcohol use No     Comment: occasionally     Allergies   Patient has no known allergies.   Review of Systems Review of Systems  Constitutional: Negative for activity change.       All ROS Neg except as noted in HPI  HENT: Negative for nosebleeds.   Eyes: Negative for photophobia and discharge.  Respiratory: Negative for cough, shortness of breath and wheezing.   Cardiovascular: Negative for chest pain and palpitations.  Gastrointestinal: Negative for abdominal pain and blood in stool.  Genitourinary: Positive for discharge. Negative for dysuria, frequency, genital sores, hematuria, scrotal swelling and testicular pain.  Musculoskeletal: Negative for arthralgias, back pain and neck pain.  Skin: Negative.   Neurological: Negative for dizziness, seizures and speech difficulty.  Psychiatric/Behavioral: Negative for confusion and hallucinations.     Physical Exam Updated Vital Signs BP (!) 137/58   Pulse 88   Temp 98.9 F (37.2 C) (Oral)   Resp 18   Ht 6\' 4"  (1.93 m)   Wt 136.1 kg (300 lb)   SpO2  100%   BMI 36.52 kg/m   Physical Exam  Constitutional: He is oriented to person, place, and time. He appears well-developed and well-nourished.  Non-toxic appearance.  HENT:  Head: Normocephalic.  Right Ear: Tympanic membrane and external ear normal.  Left Ear: Tympanic membrane and external ear normal.  Eyes: Pupils are equal, round, and reactive to light. EOM and lids are normal.  Neck: Normal range of motion. Neck supple. Carotid bruit is not present.  Cardiovascular: Normal rate, regular rhythm, normal heart sounds, intact distal pulses and  normal pulses.   Pulmonary/Chest: Breath sounds normal. No respiratory distress.  Abdominal: Soft. Bowel sounds are normal. There is no tenderness. There is no guarding.  Genitourinary: Testes normal. Circumcised. Discharge found.  Musculoskeletal: Normal range of motion.  Lymphadenopathy:       Head (right side): No submandibular adenopathy present.       Head (left side): No submandibular adenopathy present.    He has no cervical adenopathy. No inguinal adenopathy noted on the right or left side.  Neurological: He is alert and oriented to person, place, and time. He has normal strength. No cranial nerve deficit or sensory deficit.  Skin: Skin is warm and dry.  Psychiatric: He has a normal mood and affect. His speech is normal.  Nursing note and vitals reviewed.    ED Treatments / Results  Labs (all labs ordered are listed, but only abnormal results are displayed) Labs Reviewed - No data to display  EKG  EKG Interpretation None       Radiology No results found.  Procedures Procedures (including critical care time)  Medications Ordered in ED Medications - No data to display   Initial Impression / Assessment and Plan / ED Course  I have reviewed the triage vital signs and the nursing notes.  Pertinent labs & imaging results that were available during my care of the patient were reviewed by me and considered in my medical decision making (see chart for details).       Final Clinical Impressions(s) / ED Diagnoses MDM Patient reports possibly 3-1/2 weeks of problem both discharge, and most recently he's been having some burning with urination. His examination reveals active urethral discharge. Patient was treated in the emergency department with intramuscular Rocephin, and oral Zithromax. I discussed with the patient the importance of safe sex. I discussed with him the importance of refraining from sexual activity over the next 7 days. The patient will see the physicians  over at the health department if any additional symptoms or problems. HIV and RPR test also sent to the lab.   Pt left ED before blood work could be done. He did receive the Medications.   Final diagnoses:  Exposure to STD    New Prescriptions New Prescriptions   No medications on file     Ivery QualeBryant, Lovetta Condie, Cordelia Poche-C 07/05/17 1649    Ivery QualeBryant, Rosella Crandell, PA-C 07/05/17 1700    Mesner, Barbara CowerJason, MD 07/05/17 [phone]

## 2017-07-06 LAB — GC/CHLAMYDIA PROBE AMP (~~LOC~~) NOT AT ARMC
Chlamydia: POSITIVE — AB
Neisseria Gonorrhea: POSITIVE — AB

## 2017-08-07 ENCOUNTER — Encounter (HOSPITAL_COMMUNITY): Payer: Self-pay | Admitting: Emergency Medicine

## 2017-08-07 ENCOUNTER — Emergency Department (HOSPITAL_COMMUNITY)
Admission: EM | Admit: 2017-08-07 | Discharge: 2017-08-07 | Disposition: A | Discharge status: Home / Self Care | Payer: Self-pay | Attending: Emergency Medicine | Admitting: Emergency Medicine

## 2017-08-07 DIAGNOSIS — Z5321 Procedure and treatment not carried out due to patient leaving prior to being seen by health care provider: Secondary | ICD-10-CM | POA: Insufficient documentation

## 2017-08-07 DIAGNOSIS — R51 Headache: Secondary | ICD-10-CM | POA: Insufficient documentation

## 2017-08-07 NOTE — ED Notes (Signed)
Unable to locate patient in room or ED.
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
## 2017-08-07 NOTE — ED Notes (Signed)
Unable to locate patient in room.

## 2017-08-07 NOTE — ED Provider Notes (Signed)
It appears after reading nursing notes that the patient has left the emergency room without being seen by a provider.   Marily Memos, MD 08/07/17 [phone]

## 2018-05-05 ENCOUNTER — Emergency Department (HOSPITAL_COMMUNITY)
Admission: EM | Admit: 2018-05-05 | Discharge: 2018-05-05 | Disposition: A | Discharge status: Home / Self Care | Payer: Self-pay | Attending: Emergency Medicine | Admitting: Emergency Medicine

## 2018-05-05 ENCOUNTER — Other Ambulatory Visit: Payer: Self-pay

## 2018-05-05 ENCOUNTER — Encounter (HOSPITAL_COMMUNITY): Payer: Self-pay | Admitting: Emergency Medicine

## 2018-05-05 DIAGNOSIS — Z202 Contact with and (suspected) exposure to infections with a predominantly sexual mode of transmission: Secondary | ICD-10-CM | POA: Insufficient documentation

## 2018-05-05 DIAGNOSIS — F17210 Nicotine dependence, cigarettes, uncomplicated: Secondary | ICD-10-CM | POA: Insufficient documentation

## 2018-05-05 MED ORDER — CEFTRIAXONE SODIUM 250 MG IJ SOLR
250.0000 mg | Freq: Once | INTRAMUSCULAR | Status: AC
  Administered 2018-05-05: 250 mg via INTRAMUSCULAR
  Filled 2018-05-05: qty 250

## 2018-05-05 MED ORDER — AZITHROMYCIN 250 MG PO TABS
1000.0000 mg | ORAL_TABLET | Freq: Once | ORAL | Status: AC
  Administered 2018-05-05: 1000 mg via ORAL
  Filled 2018-05-05: qty 4

## 2018-05-05 NOTE — ED Provider Notes (Signed)
[facility] EMERGENCY DEPARTMENT Provider Note   CSN: [id number] Arrival date & time: 05/05/18  2128     History   Chief Complaint Chief Complaint  Patient presents with  . Exposure to STD    HPI Alexander Kidd is a 27 y.o. male.  HPI   Alexander Kidd is a 27 y.o. male who presents to the Emergency Department requesting evaluation for STD.  He states that his significant other was contacted by another male stating that she has gonorrhea, and he is requesting to be evaluated.  He denies any symptoms.  His current significant other is also here for evaluation.  No burning with urination, penile discharge, testicular pain or swelling.  No abdominal pain or fever.  No genital lesions.   Past Medical History:  Diagnosis Date  . Bipolar disorder (HCC)   . Schizophrenia [facility])     Patient Active Problem List   Diagnosis Date Noted  . Bipolar 1 disorder (HCC) 06/04/2012    History reviewed. No pertinent surgical history.      Home Medications    Prior to Admission medications   Medication Sig Start Date End Date Taking? Authorizing Provider  amoxicillin (AMOXIL) 500 MG capsule Take 1 capsule (500 mg total) by mouth 3 (three) times daily. Patient not taking: Reported on 11/05/2016 08/29/16   Ivery Quale, PA-C  ibuprofen (ADVIL,MOTRIN) 600 MG tablet Take 1 tablet (600 mg total) by mouth 4 (four) times daily. Patient not taking: Reported on 11/05/2016 08/29/16   Ivery Quale, PA-C  lamoTRIgine (LAMICTAL) 25 MG tablet Take 1 tab daily for 1 week and than 2 tab daily Patient not taking: Reported on 11/05/2016 06/04/12   Arfeen, Phillips Grout, MD  QUEtiapine (SEROQUEL) 100 MG tablet Take 1 tablet (100 mg total) by mouth 2 (two) times daily. Patient not taking: Reported on 11/05/2016 06/04/12 11/05/16  Cleotis Nipper, MD    Family History Family History  Problem Relation Age of Onset  . Bipolar disorder Father   . Bipolar disorder Sister     Social History Social  History   Tobacco Use  . Smoking status: Current Every Day Smoker    Packs/day: 1.00    Types: Cigarettes  . Smokeless tobacco: Never Used  Substance Use Topics  . Alcohol use: Yes    Comment: occasionally  . Drug use: Yes    Types: Marijuana    Comment: 05/05/18     Allergies   Patient has no known allergies.   Review of Systems Review of Systems  Constitutional: Negative for activity change, appetite change, chills and fever.  Respiratory: Negative for chest tightness and shortness of breath.   Gastrointestinal: Negative for abdominal pain, nausea and vomiting.  Genitourinary: Negative for decreased urine volume, discharge, dysuria, genital sores, penile swelling, scrotal swelling, testicular pain and urgency.  Musculoskeletal: Negative for back pain.  Skin: Negative for rash.  Neurological: Negative for dizziness, weakness and numbness.  Hematological: Negative for adenopathy.  All other systems reviewed and are negative.    Physical Exam Updated Vital Signs BP (!) 158/97   Pulse (!) 102   Temp 98.7 F (37.1 C) (Tympanic)   Resp 18   Wt (!) 140.1 kg (308 lb 14.4 oz)   SpO2 100%   BMI 37.60 kg/m   Physical Exam  Constitutional: He is oriented to person, place, and time. He appears well-developed and well-nourished. No distress.  HENT:  Head: Normocephalic and atraumatic.  Cardiovascular: Normal rate, regular rhythm and intact  distal pulses.  No murmur heard. Pulmonary/Chest: Effort normal and breath sounds normal. No respiratory distress. He has no wheezes. He has no rales.  Abdominal: Soft. Normal appearance. He exhibits no distension. There is no hepatosplenomegaly. There is no tenderness. There is no rigidity, no guarding, no CVA tenderness and no tenderness at McBurney's point.  Mild ttp of the suprapubic region.  Remaining abdomen is soft, non-tender without guarding or rebound tenderness. No CVA tenderness  Genitourinary: Testes normal and penis normal.  Cremasteric reflex is present. Right testis shows no mass, no swelling and no tenderness. Left testis shows no mass, no swelling and no tenderness. Circumcised.  Genitourinary Comments: No penile discharge noted  Musculoskeletal: Normal range of motion. He exhibits no edema.  Neurological: He is alert and oriented to person, place, and time. Coordination normal.  Skin: Skin is warm and dry. No rash noted.  Psychiatric: He has a normal mood and affect.  Nursing note and vitals reviewed.    ED Treatments / Results  Labs (all labs ordered are listed, but only abnormal results are displayed) Labs Reviewed  GC/CHLAMYDIA PROBE AMP ([place]) NOT AT [facility]    EKG None  Radiology No results found.  Procedures Procedures (including critical care time)  Medications Ordered in ED Medications  cefTRIAXone (ROCEPHIN) injection 250 mg (has no administration in time range)  azithromycin (ZITHROMAX) tablet 1,000 mg (has no administration in time range)     Initial Impression / Assessment and Plan / ED Course  I have reviewed the triage vital signs and the nursing notes.  Pertinent labs & imaging results that were available during my care of the patient were reviewed by me and considered in my medical decision making (see chart for details).     Patient asymptomatic, here requesting evaluation for STD and to be treated.  Cultures are pending.  Patient given IM Rocephin and p.o. Zithromax.  Will also treat significant other.  Patient advised to follow-up with the health department for further STD evaluations if needed.  Patient informed that he will be notified for any positive test results.  Final Clinical Impressions(s) / ED Diagnoses   Final diagnoses:  Possible exposure to STD    ED Discharge Orders    None       Rosey Bathriplett, Christophr Calix, PA-C 05/05/18 2232    Benjiman CorePickering, Nathan, MD 05/05/18 [phone]

## 2018-05-05 NOTE — ED Triage Notes (Signed)
Pt reports he wants to be seen for an STD check, may have been exposed to gonorrhea. Denies sx.

## 2018-05-05 NOTE — Discharge Instructions (Addendum)
Follow-up with the health department for recheck if needed.

## 2018-05-05 NOTE — ED Notes (Signed)
Spec to lab

## 2018-05-05 NOTE — ED Notes (Signed)
Exposed by his ex-girlfriend ? To STD  Here for check  Education: Condoms No sex until culture returned   Follow at health department  Consider HIV testing should cultures be positive

## 2018-05-07 LAB — GC/CHLAMYDIA PROBE AMP (~~LOC~~) NOT AT ARMC
Chlamydia: NEGATIVE
Neisseria Gonorrhea: POSITIVE — AB

## 2018-07-09 ENCOUNTER — Emergency Department (HOSPITAL_COMMUNITY): Payer: Self-pay

## 2018-07-09 ENCOUNTER — Emergency Department (HOSPITAL_COMMUNITY)
Admission: EM | Admit: 2018-07-09 | Discharge: 2018-07-10 | Disposition: A | Discharge status: Home / Self Care | Payer: Self-pay | Attending: Emergency Medicine | Admitting: Emergency Medicine

## 2018-07-09 ENCOUNTER — Encounter (HOSPITAL_COMMUNITY): Payer: Self-pay | Admitting: Emergency Medicine

## 2018-07-09 ENCOUNTER — Other Ambulatory Visit: Payer: Self-pay

## 2018-07-09 DIAGNOSIS — Y929 Unspecified place or not applicable: Secondary | ICD-10-CM | POA: Insufficient documentation

## 2018-07-09 DIAGNOSIS — Y999 Unspecified external cause status: Secondary | ICD-10-CM | POA: Insufficient documentation

## 2018-07-09 DIAGNOSIS — Y939 Activity, unspecified: Secondary | ICD-10-CM | POA: Insufficient documentation

## 2018-07-09 DIAGNOSIS — W1789XA Other fall from one level to another, initial encounter: Secondary | ICD-10-CM | POA: Insufficient documentation

## 2018-07-09 DIAGNOSIS — F17210 Nicotine dependence, cigarettes, uncomplicated: Secondary | ICD-10-CM | POA: Insufficient documentation

## 2018-07-09 DIAGNOSIS — M25571 Pain in right ankle and joints of right foot: Secondary | ICD-10-CM | POA: Insufficient documentation

## 2018-07-09 NOTE — ED Triage Notes (Signed)
Patient states he fell on his right ankle yesterday. Complaining of pain since injury.

## 2018-07-10 MED ORDER — HYDROCODONE-ACETAMINOPHEN 5-325 MG PO TABS
1.0000 | ORAL_TABLET | Freq: Once | ORAL | Status: AC
  Administered 2018-07-10: 1 via ORAL
  Filled 2018-07-10: qty 1

## 2018-07-10 MED ORDER — NAPROXEN 500 MG PO TABS: 500.0000 mg | ORAL_TABLET | Freq: Two times a day (BID) | ORAL | 0 refills | Status: DC

## 2018-07-10 NOTE — ED Provider Notes (Signed)
[facility] EMERGENCY DEPARTMENT Provider Note   CSN: [id number] Arrival date & time: 07/09/18  2222     History   Chief Complaint Chief Complaint  Patient presents with  . Ankle Injury    HPI Alexander Kidd is a 27 y.o. male who presents to the ED with ankle pain. Patient reports he fell on his right ankle yesterday. He continues to have pain. 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 He landed on his right foot and turned his ankle outward.   HPI  Past Medical History:  Diagnosis Date  . Bipolar disorder (HCC)   . Schizophrenia [facility])     Patient Active Problem List   Diagnosis Date Noted  . Bipolar 1 disorder (HCC) 06/04/2012    History reviewed. No pertinent surgical history.      Home Medications    Prior to Admission medications   Medication Sig Start Date End Date Taking? Authorizing Provider  naproxen (NAPROSYN) 500 MG tablet Take 1 tablet (500 mg total) by mouth 2 (two) times daily. 07/10/18   Janne Napoleon, NP  QUEtiapine (SEROQUEL) 100 MG tablet Take 1 tablet (100 mg total) by mouth 2 (two) times daily. Patient not taking: Reported on 11/05/2016 06/04/12 11/05/16  Cleotis Nipper, MD    Family History Family History  Problem Relation Age of Onset  . Bipolar disorder Father   . Bipolar disorder Sister     Social History Social History   Tobacco Use  . Smoking status: Current Every Day Smoker    Packs/day: 1.00    Types: Cigarettes  . Smokeless tobacco: Never Used  Substance Use Topics  . Alcohol use: Yes    Comment: occasionally  . Drug use: Yes    Types: Marijuana    Comment: 05/05/18     Allergies   Patient has no known allergies.   Review of Systems Review of Systems  Musculoskeletal: Positive for arthralgias and joint swelling.  All other systems reviewed and are negative.    Physical Exam Updated Vital Signs BP (!) 152/77 (BP Location: Right Arm)   Pulse 92   Temp 98.4 F (36.9 C) (Oral)   Resp 20    Ht 6\' 2"  (1.88 m)   Wt (!) 142.1 kg   SpO2 98%   BMI 40.22 kg/m   Physical Exam  Constitutional: He appears well-developed and well-nourished. No distress.  HENT:  Head: Normocephalic.  Eyes: EOM are normal.  Neck: Neck supple.  Cardiovascular: Normal rate.  Pulmonary/Chest: Effort normal.  Musculoskeletal:       Right ankle: He exhibits swelling. He exhibits no deformity, no laceration and normal pulse. Decreased range of motion: due to pain. Tenderness. Medial malleolus tenderness found. Achilles tendon normal.  Neurological: He is alert.  Skin: Skin is warm and dry.  Psychiatric: He has a normal mood and affect.  Nursing note and vitals reviewed.    ED Treatments / Results  Labs (all labs ordered are listed, but only abnormal results are displayed) Labs Reviewed - No data to display Radiology Dg Ankle Complete Right  Result Date: 07/09/2018 CLINICAL DATA:  Pain and swelling. EXAM: RIGHT ANKLE - COMPLETE 3+ VIEW COMPARISON:  None. FINDINGS: There is no evidence of fracture, dislocation, or joint effusion. There is no evidence of arthropathy or other focal bone abnormality. Soft tissues are unremarkable. IMPRESSION: Negative. Electronically Signed   By: Gerome Sam III M.D   On: 07/09/2018 23:17  Procedures Procedures (including critical care time)  Medications Ordered in ED Medications  HYDROcodone-acetaminophen (NORCO/VICODIN) 5-325 MG per tablet 1 tablet (has no administration in time range)     Initial Impression / Assessment and Plan / ED Course  I have reviewed the triage vital signs and the nursing notes. 27 y.o. male here with right ankle pain and swelling s/p fall yesterday stable for d/c without fracture or dislocation noted on x-ray and no focal neuro deficits. Will treat for ankle sprain with ASO, ice, elevation, NSAIDS and crutches. Return precautions discussed.   Final Clinical Impressions(s) / ED Diagnoses   Final diagnoses:  Acute right ankle  pain    ED Discharge Orders         Ordered    naproxen (NAPROSYN) 500 MG tablet  2 times daily     07/10/18 0016           Kerrie Buffalo Fertile, NP 07/10/18 Terisa Starr, MD 07/10/18 [phone]

## 2018-10-10 ENCOUNTER — Emergency Department (HOSPITAL_COMMUNITY): Payer: Self-pay

## 2018-10-10 ENCOUNTER — Emergency Department (HOSPITAL_COMMUNITY)
Admission: EM | Admit: 2018-10-10 | Discharge: 2018-10-11 | Disposition: A | Discharge status: Home / Self Care | Payer: Self-pay | Attending: Emergency Medicine | Admitting: Emergency Medicine

## 2018-10-10 ENCOUNTER — Other Ambulatory Visit: Payer: Self-pay

## 2018-10-10 ENCOUNTER — Encounter (HOSPITAL_COMMUNITY): Payer: Self-pay | Admitting: Emergency Medicine

## 2018-10-10 DIAGNOSIS — F17210 Nicotine dependence, cigarettes, uncomplicated: Secondary | ICD-10-CM | POA: Insufficient documentation

## 2018-10-10 DIAGNOSIS — Z79899 Other long term (current) drug therapy: Secondary | ICD-10-CM | POA: Insufficient documentation

## 2018-10-10 DIAGNOSIS — J020 Streptococcal pharyngitis: Secondary | ICD-10-CM | POA: Insufficient documentation

## 2018-10-10 MED ORDER — HYDROCODONE-ACETAMINOPHEN 7.5-325 MG/15ML PO SOLN
10.0000 mL | Freq: Once | ORAL | Status: AC
  Administered 2018-10-10: 10 mL via ORAL
  Filled 2018-10-10: qty 15

## 2018-10-10 NOTE — ED Triage Notes (Signed)
Patient reports onset of sore throat earlier today.

## 2018-10-10 NOTE — ED Provider Notes (Signed)
[facility] EMERGENCY DEPARTMENT Provider Note   CSN: [id number] Arrival date & time: 10/10/18  2138     History   Chief Complaint Chief Complaint  Patient presents with  . Sore Throat    HPI Alexander Kidd is a 27 y.o. male.  HPI   Alexander Kidd is a 27 y.o. male who presents to the Emergency Department complaining of sore throat and muffled voice.  Sore throat began yesterday.  He notes increasing pain associated with swallowing and has been unable to swallow solid foods and tolerating small amounts of liquids for 2 days.  He denies rash, abdominal pain, fever, and vomiting or diarrhea.  No cough or ear pain.  No recent sick contacts.   Past Medical History:  Diagnosis Date  . Bipolar disorder (HCC)   . Schizophrenia [facility](HCC)     Patient Active Problem List   Diagnosis Date Noted  . Bipolar 1 disorder (HCC) 06/04/2012    History reviewed. No pertinent surgical history.    Home Medications    Prior to Admission medications   Medication Sig Start Date End Date Taking? Authorizing Provider  naproxen (NAPROSYN) 500 MG tablet Take 1 tablet (500 mg total) by mouth 2 (two) times daily. 07/10/18   Janne NapoleonNeese, Hope M, NP  QUEtiapine (SEROQUEL) 100 MG tablet Take 1 tablet (100 mg total) by mouth 2 (two) times daily. Patient not taking: Reported on 11/05/2016 06/04/12 11/05/16  Cleotis NipperArfeen, Syed T, MD    Family History Family History  Problem Relation Age of Onset  . Bipolar disorder Father   . Bipolar disorder Sister     Social History Social History   Tobacco Use  . Smoking status: Current Every Day Smoker    Packs/day: 0.50    Types: Cigarettes  . Smokeless tobacco: Never Used  Substance Use Topics  . Alcohol use: Yes    Comment: occasionally  . Drug use: Not Currently    Types: Marijuana    Comment: 05/05/18     Allergies   Patient has no known allergies.   Review of Systems Review of Systems  Constitutional: Negative for activity change, appetite change,  chills and fever.  HENT: Positive for sore throat, trouble swallowing and voice change. Negative for congestion, ear pain and facial swelling.   Eyes: Negative for pain and visual disturbance.  Respiratory: Negative for cough and shortness of breath.   Gastrointestinal: Negative for abdominal pain, diarrhea, nausea and vomiting.  Musculoskeletal: Negative for arthralgias, neck pain and neck stiffness.  Skin: Negative for rash.  Neurological: Negative for dizziness, facial asymmetry, speech difficulty, numbness and headaches.  Hematological: Negative for adenopathy.     Physical Exam Updated Vital Signs BP (!) 143/69 (BP Location: Right Arm)   Pulse 77   Temp 98.4 F (36.9 C) (Oral)   Resp 16   Ht 6\' 2"  (1.88 m)   Wt (!) 137.9 kg   SpO2 95%   BMI 39.03 kg/m   Physical Exam  Constitutional: He appears well-developed.  Uncomfortable appearing  HENT:  Right Ear: Tympanic membrane and ear canal normal.  Left Ear: Tympanic membrane and ear canal normal.  Mouth/Throat: Uvula is midline and mucous membranes are normal. No oral lesions. No uvula swelling. Posterior oropharyngeal erythema present. No oropharyngeal exudate, posterior oropharyngeal edema or tonsillar abscesses. No tonsillar exudate.  Patient has hot potato voice, uvula is midline and nonedematous.  No exudate or tonsillar edema.  Nursing note and vitals reviewed.    ED Treatments /  Results  Labs (all labs ordered are listed, but only abnormal results are displayed) Labs Reviewed - No data to display  EKG None  Radiology Dg Neck Soft Tissue  Result Date: 10/10/2018 CLINICAL DATA:  Sore throat.  Question retropharyngeal abscess. EXAM: NECK SOFT TISSUES - 1+ VIEW COMPARISON:  None. FINDINGS: There is no evidence of retropharyngeal soft tissue swelling or epiglottic enlargement. The cervical airway is unremarkable and no radio-opaque foreign body identified. Prominent adenoidal soft tissue IMPRESSION: Prominent  adenoids.  No acute findings. Electronically Signed   By: Charlett Nose M.D.   On: 10/10/2018 23:27    Procedures Procedures (including critical care time)  Medications Ordered in ED Medications - No data to display   Initial Impression / Assessment and Plan / ED Course  I have reviewed the triage vital signs and the nursing notes.  Pertinent labs & imaging results that were available during my care of the patient were reviewed by me and considered in my medical decision making (see chart for details).     Patient uncomfortable appearing.  Nontoxic.  Has hot potato voice, is handling his own secretions well.  Will obtain strep testing and soft tissue neck  Plain film of soft tissue neck is negative for retropharyngeal swelling or epiglottic enlargement.  Strep is positive.  Patient agrees to IM dose of Bicillin.  Vitals reassuring.  Appears stable for discharge home, agrees to outpatient follow-up if needed  Final Clinical Impressions(s) / ED Diagnoses   Final diagnoses:  Strep pharyngitis    ED Discharge Orders    None       Pauline Aus, PA-C 10/11/18 0107    Mesner, Barbara Cower, MD 10/11/18 2356

## 2018-10-11 LAB — GROUP A STREP BY PCR: Group A Strep by PCR: DETECTED — AB

## 2018-10-11 MED ORDER — IBUPROFEN 800 MG PO TABS: 800.0000 mg | ORAL_TABLET | Freq: Three times a day (TID) | ORAL | 0 refills | Status: DC

## 2018-10-11 MED ORDER — PENICILLIN G BENZATHINE 1200000 UNIT/2ML IM SUSP
1.2000 10*6.[IU] | Freq: Once | INTRAMUSCULAR | Status: AC
  Administered 2018-10-11: 1.2 10*6.[IU] via INTRAMUSCULAR
  Filled 2018-10-11: qty 2

## 2018-10-11 NOTE — Discharge Instructions (Addendum)
Drink plenty of fluids.  You may take Tylenol every 4 hours if needed for fever.  You have been treated here for strep throat.  Follow-up with your primary doctor or return to the ER for any worsening symptoms.

## 2018-11-24 ENCOUNTER — Other Ambulatory Visit: Payer: Self-pay

## 2018-11-24 ENCOUNTER — Emergency Department (HOSPITAL_COMMUNITY)
Admission: EM | Admit: 2018-11-24 | Discharge: 2018-11-24 | Disposition: A | Discharge status: Home / Self Care | Payer: Self-pay | Attending: Emergency Medicine | Admitting: Emergency Medicine

## 2018-11-24 ENCOUNTER — Encounter (HOSPITAL_COMMUNITY): Payer: Self-pay | Admitting: Emergency Medicine

## 2018-11-24 DIAGNOSIS — F319 Bipolar disorder, unspecified: Secondary | ICD-10-CM | POA: Insufficient documentation

## 2018-11-24 DIAGNOSIS — F17210 Nicotine dependence, cigarettes, uncomplicated: Secondary | ICD-10-CM | POA: Insufficient documentation

## 2018-11-24 DIAGNOSIS — Z202 Contact with and (suspected) exposure to infections with a predominantly sexual mode of transmission: Secondary | ICD-10-CM | POA: Insufficient documentation

## 2018-11-24 LAB — URINALYSIS, ROUTINE W REFLEX MICROSCOPIC
Bacteria, UA: NONE SEEN
Bilirubin Urine: NEGATIVE
Glucose, UA: NEGATIVE mg/dL
Ketones, ur: NEGATIVE mg/dL
Nitrite: NEGATIVE
Protein, ur: 30 mg/dL — AB
Specific Gravity, Urine: 1.031 — ABNORMAL HIGH (ref 1.005–1.030)
pH: 5 (ref 5.0–8.0)

## 2018-11-24 MED ORDER — CEFTRIAXONE SODIUM 250 MG IJ SOLR
250.0000 mg | Freq: Once | INTRAMUSCULAR | Status: AC
  Administered 2018-11-24: 250 mg via INTRAMUSCULAR
  Filled 2018-11-24: qty 250

## 2018-11-24 MED ORDER — LIDOCAINE HCL (PF) 1 % IJ SOLN
INTRAMUSCULAR | Status: AC
  Administered 2018-11-24: 19:00:00
  Filled 2018-11-24: qty 5

## 2018-11-24 MED ORDER — METRONIDAZOLE 500 MG PO TABS: 500.0000 mg | ORAL_TABLET | Freq: Two times a day (BID) | ORAL | 0 refills | Status: DC

## 2018-11-24 MED ORDER — METRONIDAZOLE 500 MG PO TABS
500.0000 mg | ORAL_TABLET | Freq: Once | ORAL | Status: AC
  Administered 2018-11-24: 500 mg via ORAL
  Filled 2018-11-24: qty 1

## 2018-11-24 MED ORDER — AZITHROMYCIN 250 MG PO TABS
1000.0000 mg | ORAL_TABLET | Freq: Once | ORAL | Status: AC
  Administered 2018-11-24: 1000 mg via ORAL
  Filled 2018-11-24: qty 4

## 2018-11-24 MED ORDER — ONDANSETRON HCL 4 MG PO TABS
4.0000 mg | ORAL_TABLET | Freq: Once | ORAL | Status: AC
  Administered 2018-11-24: 4 mg via ORAL
  Filled 2018-11-24: qty 1

## 2018-11-24 NOTE — Discharge Instructions (Addendum)
Your treatment for trichomonas was started here in the emergency department.  Please use Flagyl 2 times daily until all taken.  Please refrain from all alcohol while taking this medication, even mouthwash.  You were also treated with intramuscular Rocephin and oral Zithromax to cover some of the organisms that frequently travel with trichomonas.  Please refrain from all sexual activity over the next 7 days.  Following the 7-day.,  Please practice safe sex.

## 2018-11-24 NOTE — ED Triage Notes (Signed)
Patient here for STD check/treatment. Per patient sexual partner diagnosed x1 week ago with trichomoniasis. Denies any discharge, pain, or lesions.

## 2018-11-24 NOTE — ED Provider Notes (Signed)
[facility] EMERGENCY DEPARTMENT Provider Note   CSN: [id number] Arrival date & time: 11/24/18  1729     History   Chief Complaint Chief Complaint  Patient presents with  . SEXUALLY TRANSMITTED DISEASE    HPI Alexander Kidd is a 28 y.o. male.  The history is provided by the patient.  Exposure to STD  This is a new problem. Episode frequency: No symptoms. Pt told by sexual pardner of positive test for Trich. The problem has not changed since onset.Pertinent negatives include no chest pain, no abdominal pain, no headaches and no shortness of breath. Nothing aggravates the symptoms. Nothing relieves the symptoms. He has tried nothing for the symptoms.    Past Medical History:  Diagnosis Date  . Bipolar disorder (HCC)   . Schizophrenia [facility](HCC)     Patient Active Problem List   Diagnosis Date Noted  . Bipolar 1 disorder (HCC) 06/04/2012    History reviewed. No pertinent surgical history.      Home Medications    Prior to Admission medications   Medication Sig Start Date End Date Taking? Authorizing Provider  ibuprofen (ADVIL,MOTRIN) 800 MG tablet Take 1 tablet (800 mg total) by mouth 3 (three) times daily. 10/11/18   Triplett, Tammy, PA-C  naproxen (NAPROSYN) 500 MG tablet Take 1 tablet (500 mg total) by mouth 2 (two) times daily. 07/10/18   Janne NapoleonNeese, Hope M, NP  QUEtiapine (SEROQUEL) 100 MG tablet Take 1 tablet (100 mg total) by mouth 2 (two) times daily. Patient not taking: Reported on 11/05/2016 06/04/12 11/05/16  Cleotis NipperArfeen, Syed T, MD    Family History Family History  Problem Relation Age of Onset  . Bipolar disorder Father   . Bipolar disorder Sister     Social History Social History   Tobacco Use  . Smoking status: Current Every Day Smoker    Packs/day: 0.50    Types: Cigarettes  . Smokeless tobacco: Never Used  Substance Use Topics  . Alcohol use: Yes    Comment: occasionally  . Drug use: Yes    Types: Marijuana     Allergies   Patient has no known  allergies.   Review of Systems Review of Systems  Constitutional: Negative for activity change.       All ROS Neg except as noted in HPI  HENT: Negative for nosebleeds.   Eyes: Negative for photophobia and discharge.  Respiratory: Negative for cough, shortness of breath and wheezing.   Cardiovascular: Negative for chest pain and palpitations.  Gastrointestinal: Negative for abdominal pain and blood in stool.  Genitourinary: Negative for dysuria, frequency and hematuria.  Musculoskeletal: Negative for arthralgias, back pain and neck pain.  Skin: Negative.   Neurological: Negative for dizziness, seizures, speech difficulty and headaches.  Psychiatric/Behavioral: Negative for confusion and hallucinations.     Physical Exam Updated Vital Signs BP 128/85 (BP Location: Right Arm)   Pulse (!) 101   Temp 98.7 F (37.1 C) (Oral)   Resp 14   Ht 6\' 3"  (1.905 m)   Wt 132.2 kg   SpO2 98%   BMI 36.43 kg/m   Physical Exam Vitals signs and nursing note reviewed.  Constitutional:      Appearance: He is well-developed. He is not toxic-appearing.  HENT:     Head: Normocephalic.     Right Ear: Tympanic membrane and external ear normal.     Left Ear: Tympanic membrane and external ear normal.  Eyes:     General: Lids are normal.  Pupils: Pupils are equal, round, and reactive to light.  Neck:     Musculoskeletal: Normal range of motion and neck supple.     Vascular: No carotid bruit.  Cardiovascular:     Rate and Rhythm: Normal rate and regular rhythm.     Pulses: Normal pulses.     Heart sounds: Normal heart sounds.  Pulmonary:     Effort: No respiratory distress.     Breath sounds: Normal breath sounds.  Abdominal:     General: Bowel sounds are normal.     Palpations: Abdomen is soft.     Tenderness: There is no abdominal tenderness. There is no guarding.  Genitourinary:    Pubic Area: No rash.      Penis: Circumcised. No tenderness, discharge or lesions.       Scrotum/Testes: Normal.        Right: Tenderness or swelling not present.        Left: Tenderness or swelling not present.     Epididymis:     Right: Normal.     Left: Normal.  Musculoskeletal: Normal range of motion.  Lymphadenopathy:     Head:     Right side of head: No submandibular adenopathy.     Left side of head: No submandibular adenopathy.     Cervical: No cervical adenopathy.     Lower Body: No right inguinal adenopathy. No left inguinal adenopathy.  Skin:    General: Skin is warm and dry.  Neurological:     Mental Status: He is alert and oriented to person, place, and time.     Cranial Nerves: No cranial nerve deficit.     Sensory: No sensory deficit.  Psychiatric:        Speech: Speech normal.      ED Treatments / Results  Labs (all labs ordered are listed, but only abnormal results are displayed) Labs Reviewed  HIV ANTIBODY (ROUTINE TESTING W REFLEX)  RPR  URINALYSIS, ROUTINE W REFLEX MICROSCOPIC  GC/CHLAMYDIA PROBE AMP ([place]) NOT AT [facility]    EKG None  Radiology No results found.  Procedures Procedures (including critical care time)  Medications Ordered in ED Medications - No data to display   Initial Impression / Assessment and Plan / ED Course  I have reviewed the triage vital signs and the nursing notes.  Pertinent labs & imaging results that were available during my care of the patient were reviewed by me and considered in my medical decision making (see chart for details).       Final Clinical Impressions(s) / ED Diagnoses MDM  Vital signs within normal limits.  Patient states he is not had any symptoms, but was told by his significant other that she was diagnosed recently with trichomonas and that he should be treated.  Patient denies any fever, chills, rash, discharge, or other problems.  Patient treated with Flagyl, Zithromax, and intramuscular Rocephin.   Final diagnoses:  None    ED Discharge Orders    None         Ivery Quale, Cordelia Poche 11/24/18 Beverly Sessions, MD 11/24/18 2245

## 2018-11-26 LAB — GC/CHLAMYDIA PROBE AMP (~~LOC~~) NOT AT ARMC
Chlamydia: NEGATIVE
Neisseria Gonorrhea: NEGATIVE

## 2018-11-26 LAB — HIV ANTIBODY (ROUTINE TESTING W REFLEX): HIV Screen 4th Generation wRfx: NONREACTIVE

## 2018-11-26 LAB — RPR: RPR Ser Ql: NONREACTIVE

## 2018-12-08 ENCOUNTER — Emergency Department (HOSPITAL_COMMUNITY): Payer: Self-pay

## 2018-12-08 ENCOUNTER — Emergency Department (HOSPITAL_COMMUNITY)
Admission: EM | Admit: 2018-12-08 | Discharge: 2018-12-08 | Disposition: A | Discharge status: Home / Self Care | Payer: Self-pay | Attending: Emergency Medicine | Admitting: Emergency Medicine

## 2018-12-08 ENCOUNTER — Encounter (HOSPITAL_COMMUNITY): Payer: Self-pay | Admitting: Emergency Medicine

## 2018-12-08 ENCOUNTER — Other Ambulatory Visit: Payer: Self-pay

## 2018-12-08 DIAGNOSIS — F17210 Nicotine dependence, cigarettes, uncomplicated: Secondary | ICD-10-CM | POA: Insufficient documentation

## 2018-12-08 DIAGNOSIS — J111 Influenza due to unidentified influenza virus with other respiratory manifestations: Secondary | ICD-10-CM | POA: Insufficient documentation

## 2018-12-08 DIAGNOSIS — R69 Illness, unspecified: Secondary | ICD-10-CM

## 2018-12-08 DIAGNOSIS — F319 Bipolar disorder, unspecified: Secondary | ICD-10-CM | POA: Insufficient documentation

## 2018-12-08 DIAGNOSIS — F209 Schizophrenia, unspecified: Secondary | ICD-10-CM | POA: Insufficient documentation

## 2018-12-08 MED ORDER — IBUPROFEN 800 MG PO TABS: 800.0000 mg | ORAL_TABLET | Freq: Three times a day (TID) | ORAL | 0 refills | Status: DC

## 2018-12-08 MED ORDER — ACETAMINOPHEN 325 MG PO TABS
650.0000 mg | ORAL_TABLET | Freq: Once | ORAL | Status: AC | PRN
  Administered 2018-12-08: 650 mg via ORAL
  Filled 2018-12-08: qty 2

## 2018-12-08 MED ORDER — BENZONATATE 100 MG PO CAPS: 100.0000 mg | ORAL_CAPSULE | Freq: Three times a day (TID) | ORAL | 0 refills | Status: AC

## 2018-12-08 MED ORDER — OSELTAMIVIR PHOSPHATE 75 MG PO CAPS: 75.0000 mg | ORAL_CAPSULE | Freq: Two times a day (BID) | ORAL | 0 refills | Status: AC

## 2018-12-08 NOTE — Discharge Instructions (Addendum)
Please see the list of family doctors below to follow-up if no improvement within 3 or 4 days.  Please stay out of work for the next 72 hours as you are likely contagious to others.  Take Tamiflu twice daily for 5 days and Tessalon as needed for coughing every 8 hours.  Seek medical exam for severe or worsening symptoms but be aware that you may be symptomatic for as long as 7 to 10 days.  [facility] Primary Care Doctor List    Kari Baars MD. Specialty: Pulmonary Disease Contact information: [address]  [address]  [place] [address]  [phone]   Syliva Overman, MD. Specialty: [facility] Medicine Contact information: [address]  [place] [address]  [phone]   Lilyan Punt, MD. Specialty: [facility] Medicine Contact information: [address]  [place] [address]  [phone]   Avon Gully, MD Specialty: Internal Medicine Contact information: [address] [place] [address]  [phone]   Catalina Pizza, MD. Specialty: Internal Medicine Contact information: [address]  [place] [address]  [phone]    [facility] (Dr. Selena Batten) Specialty: Family Medicine Contact information: [address]  [place] [address]  [phone]   John Giovanni, MD. Specialty: [facility] Medicine Contact information: [address]  [address]  [place] [address]  [phone]   Carylon Perches, MD. Specialty: Internal Medicine Contact information: [address]  [address]  [place] [address]  [phone]    [facility]  [address] [place], [place] [address] [phone]  Services [facility] offers a variety of basic health services.  Services include but are not limited to: Blood pressure checks  Heart rate checks  Blood sugar checks  Urine analysis  Rapid strep tests  Pregnancy tests.  Health education and  referrals  People needing more complex services will be directed to a physician online. Using these virtual visits, doctors can evaluate and prescribe medicine and treatments. There will be no medication on-site, though [facility] will help patients fill their prescriptions at little to no cost.   For More information please go to: [email or web address]

## 2018-12-08 NOTE — ED Provider Notes (Signed)
[facility] EMERGENCY DEPARTMENT Provider Note   CSN: [id number] Arrival date & time: 12/08/18  1201     History   Chief Complaint Chief Complaint  Patient presents with  . Fever    HPI Alexander Kidd is a 28 y.o. male.  HPI  28 year old male, history of bipolar disorder as well as schizophrenia who presents with 2 days of high fever, body aches, sweats, chills, coughing, sore throat and a headache.  This comes after being exposed to his niece who had similar symptoms.  He states that he is coughing but he is not bringing up phlegm, has no rashes, swelling, abdominal pain.  The muscular pain is in his arms legs and lower back, it is not associated with a rash or dysuria and has no rectal bleeding.  Symptoms are persistent, no medication taken prior to arrival.  Past Medical History:  Diagnosis Date  . Bipolar disorder (HCC)   . Schizophrenia [facility](HCC)     Patient Active Problem List   Diagnosis Date Noted  . Bipolar 1 disorder (HCC) 06/04/2012    History reviewed. No pertinent surgical history.      Home Medications    Prior to Admission medications   Medication Sig Start Date End Date Taking? Authorizing Provider  benzonatate (TESSALON) 100 MG capsule Take 1 capsule (100 mg total) by mouth every 8 (eight) hours. 12/08/18   Eber HongMiller, Yakira Duquette, MD  ibuprofen (ADVIL,MOTRIN) 800 MG tablet Take 1 tablet (800 mg total) by mouth 3 (three) times daily. 12/08/18   Eber HongMiller, Cybill Uriegas, MD  oseltamivir (TAMIFLU) 75 MG capsule Take 1 capsule (75 mg total) by mouth every 12 (twelve) hours. 12/08/18   Eber HongMiller, Roldan Laforest, MD  QUEtiapine (SEROQUEL) 100 MG tablet Take 1 tablet (100 mg total) by mouth 2 (two) times daily. Patient not taking: Reported on 11/05/2016 06/04/12 11/05/16  Cleotis NipperArfeen, Syed T, MD    Family History Family History  Problem Relation Age of Onset  . Bipolar disorder Father   . Bipolar disorder Sister     Social History Social History   Tobacco Use  . Smoking status: Current Every  Day Smoker    Packs/day: 0.50    Types: Cigarettes  . Smokeless tobacco: Never Used  Substance Use Topics  . Alcohol use: Yes    Comment: occasionally  . Drug use: Not Currently    Types: Marijuana     Allergies   Patient has no known allergies.   Review of Systems Review of Systems  All other systems reviewed and are negative.    Physical Exam Updated Vital Signs BP (!) 154/76 (BP Location: Right Arm)   Pulse 66   Temp (!) 101.8 F (38.8 C) (Oral)   Resp 18   Ht 1.956 m (6\' 5" )   Wt 132 kg   SpO2 99%   BMI 34.51 kg/m   Physical Exam Vitals signs and nursing note reviewed.  Constitutional:      General: He is not in acute distress.    Appearance: He is well-developed. He is diaphoretic.  HENT:     Head: Normocephalic and atraumatic.     Mouth/Throat:     Pharynx: No oropharyngeal exudate.  Eyes:     General: No scleral icterus.       Right eye: No discharge.        Left eye: No discharge.     Conjunctiva/sclera: Conjunctivae normal.     Pupils: Pupils are equal, round, and reactive to light.  Neck:  Musculoskeletal: Normal range of motion and neck supple.     Thyroid: No thyromegaly.     Vascular: No JVD.  Cardiovascular:     Rate and Rhythm: Normal rate and regular rhythm.     Heart sounds: Normal heart sounds. No murmur. No friction rub. No gallop.   Pulmonary:     Effort: Pulmonary effort is normal. No respiratory distress.     Breath sounds: Normal breath sounds. No wheezing or rales.  Abdominal:     General: Bowel sounds are normal. There is no distension.     Palpations: Abdomen is soft. There is no mass.     Tenderness: There is no abdominal tenderness.  Musculoskeletal: Normal range of motion.        General: No tenderness.  Lymphadenopathy:     Cervical: No cervical adenopathy.  Skin:    General: Skin is warm.     Findings: No erythema or rash.  Neurological:     Mental Status: He is alert.     Coordination: Coordination normal.    Psychiatric:        Behavior: Behavior normal.      ED Treatments / Results  Labs (all labs ordered are listed, but only abnormal results are displayed) Labs Reviewed - No data to display  EKG None  Radiology Dg Chest 2 View  Result Date: 12/08/2018 CLINICAL DATA:  Productive cough with fever, chills and body aches for 2 days. EXAM: CHEST - 2 VIEW COMPARISON:  11/18/2011. FINDINGS: The heart size and mediastinal contours are within normal limits. Both lungs are clear. No pleural effusion or pneumothorax. The visualized skeletal structures are unremarkable. IMPRESSION: No active cardiopulmonary disease. Electronically Signed   By: Amie Portland M.D.   On: 12/08/2018 12:46    Procedures Procedures (including critical care time)  Medications Ordered in ED Medications  acetaminophen (TYLENOL) tablet 650 mg (650 mg Oral Given 12/08/18 1215)     Initial Impression / Assessment and Plan / ED Course  I have reviewed the triage vital signs and the nursing notes.  Pertinent labs & imaging results that were available during my care of the patient were reviewed by me and considered in my medical decision making (see chart for details).    The patient is otherwise very medically healthy, he appears to have a flulike illness with fevers, chills, body aches, headache and a sore throat with a cough.  I do not feel that formal flu testing is necessary, the x-ray as ordered by nursing shows no signs of pneumonia making influenza a much more likely answer.  He will be given Tamiflu as well as a cough suppressant, he is stable for discharge and was given contact precautions, airborne precautions and a work note.  Final Clinical Impressions(s) / ED Diagnoses   Final diagnoses:  Influenza-like illness    ED Discharge Orders         Ordered    ibuprofen (ADVIL,MOTRIN) 800 MG tablet  3 times daily     12/08/18 1404    oseltamivir (TAMIFLU) 75 MG capsule  Every 12 hours     12/08/18 1404     benzonatate (TESSALON) 100 MG capsule  Every 8 hours     12/08/18 1404           Eber Hong, MD 12/08/18 [phone]

## 2018-12-08 NOTE — ED Triage Notes (Signed)
Pt c/o productive cough, fever, chills, body aches, and diarrhea x 2 days.

## 2019-05-22 ENCOUNTER — Encounter (HOSPITAL_COMMUNITY): Payer: Self-pay

## 2019-05-22 ENCOUNTER — Emergency Department (HOSPITAL_COMMUNITY)
Admission: EM | Admit: 2019-05-22 | Discharge: 2019-05-22 | Disposition: A | Discharge status: Home / Self Care | Payer: Self-pay | Attending: Emergency Medicine | Admitting: Emergency Medicine

## 2019-05-22 ENCOUNTER — Other Ambulatory Visit: Payer: Self-pay

## 2019-05-22 DIAGNOSIS — L0291 Cutaneous abscess, unspecified: Secondary | ICD-10-CM

## 2019-05-22 DIAGNOSIS — F17210 Nicotine dependence, cigarettes, uncomplicated: Secondary | ICD-10-CM | POA: Insufficient documentation

## 2019-05-22 DIAGNOSIS — L02211 Cutaneous abscess of abdominal wall: Secondary | ICD-10-CM | POA: Insufficient documentation

## 2019-05-22 DIAGNOSIS — F1210 Cannabis abuse, uncomplicated: Secondary | ICD-10-CM | POA: Insufficient documentation

## 2019-05-22 MED ORDER — TRAMADOL HCL 50 MG PO TABS: 50.0000 mg | ORAL_TABLET | Freq: Four times a day (QID) | ORAL | 0 refills | Status: AC | PRN

## 2019-05-22 MED ORDER — IBUPROFEN 600 MG PO TABS: 600.0000 mg | ORAL_TABLET | Freq: Four times a day (QID) | ORAL | 0 refills | Status: AC | PRN

## 2019-05-22 MED ORDER — PENTAFLUOROPROP-TETRAFLUOROETH EX AERO
INHALATION_SPRAY | Freq: Once | CUTANEOUS | Status: AC
  Administered 2019-05-22: 17:00:00 via TOPICAL
  Filled 2019-05-22: qty 116

## 2019-05-22 MED ORDER — SULFAMETHOXAZOLE-TRIMETHOPRIM 800-160 MG PO TABS
1.0000 | ORAL_TABLET | Freq: Once | ORAL | Status: AC
  Administered 2019-05-22: 1 via ORAL
  Filled 2019-05-22: qty 1

## 2019-05-22 MED ORDER — SULFAMETHOXAZOLE-TRIMETHOPRIM 800-160 MG PO TABS: 1.0000 | ORAL_TABLET | Freq: Two times a day (BID) | ORAL | 0 refills | Status: AC

## 2019-05-22 NOTE — Discharge Instructions (Addendum)
Take your next dose of the antibiotic tomorrow morning. Continue frequent warm water soaks or compresses 10 minutes 2-3 times daily to keep the site open and draining and to help the antibiotic heal this infection.  You may take the tramadol prescribed for pain relief if the ibuprofen is not strong enough.  This will make you drowsy - do not drive within 4 hours of taking this medication.

## 2019-05-22 NOTE — ED Triage Notes (Signed)
Pt reports what he thought was a hair bump approx 2 weeks. Area has been draining and sore . Area is above pubic area

## 2019-05-23 NOTE — ED Provider Notes (Signed)
[facility] EMERGENCY [facility]PARTMENT Provider Note   CSN: [id number] Arrival date & time: 05/22/19  1428     History   Chief Complaint Chief Complaint  Patient presents with  . Abscess    HPI Alexander Kidd is a 28 y.o. male.     The history is provided by the patient.  Abscess Abscess location: suprapubic abdominal wall. Size:  2 cm Abscess quality: induration, painful, redness, warmth and weeping   Red streaking: no   Duration:  2 weeks Progression:  Worsening (it started draining a small amount of pink liquid dc today) Pain details:    Quality:  Aching   Severity:  Moderate   Timing:  Constant Chronicity:  New Context: not diabetes, not injected drug use and not skin injury   Context comment:  Started as a "hair bump"  Relieved by:  Warm compresses Worsened by:  Draining/squeezing Associated symptoms: no fever, no nausea and no vomiting   Risk factors: no hx of MRSA and no prior abscess     Past Medical History:  Diagnosis Date  . Bipolar disorder (HCC)   . Schizophrenia [facility]HCC)     Patient Active Problem List   Diagnosis Date Noted  . Bipolar 1 disorder (HCC) 06/04/2012    History reviewed. No pertinent surgical history.      Home Medications    Prior to Admission medications   Medication Sig Start Date End Date Taking? Authorizing Provider  benzonatate (TESSALON) 100 MG capsule Take 1 capsule (100 mg total) by mouth every 8 (eight) hours. 12/08/18   Eber HongMiller, Brian, MD  ibuprofen (ADVIL) 600 MG tablet Take 1 tablet (600 mg total) by mouth every 6 (six) hours as needed for mild pain. 05/22/19   Burgess AmorIdol, Flavio Lindroth, PA-C  oseltamivir (TAMIFLU) 75 MG capsule Take 1 capsule (75 mg total) by mouth every 12 (twelve) hours. 12/08/18   Eber HongMiller, Brian, MD  QUEtiapine (SEROQUEL) 100 MG tablet Take 1 tablet (100 mg total) by mouth 2 (two) times daily. Patient not taking: Reported on 11/05/2016 06/04/12 11/05/16  Arfeen, Phillips GroutSyed T, MD  sulfamethoxazole-trimethoprim (BACTRIM DS)  800-160 MG tablet Take 1 tablet by mouth 2 (two) times daily for 10 days. 05/22/19 06/01/19  Burgess AmorIdol, Harnoor Kohles, PA-C  traMADol (ULTRAM) 50 MG tablet Take 1 tablet (50 mg total) by mouth every 6 (six) hours as needed for severe pain. 05/22/19   Burgess AmorIdol, Ariyana Faw, PA-C    Family History Family History  Problem Relation Age of Onset  . Bipolar disorder Father   . Bipolar disorder Sister     Social History Social History   Tobacco Use  . Smoking status: Current Every Day Smoker    Packs/day: 0.50    Types: Cigarettes  . Smokeless tobacco: Never Used  Substance Use Topics  . Alcohol use: Yes    Comment: occasionally  . Drug use: Yes    Types: Marijuana     Allergies   Patient has no known allergies.   Review of Systems Review of Systems  Constitutional: Negative for chills and fever.  HENT: Negative.   Respiratory: Negative for shortness of breath and wheezing.   Gastrointestinal: Negative for nausea and vomiting.  Musculoskeletal: Negative.   Skin:       Negative except as mentioned in HPI.   Neurological: Negative for numbness.     Physical Exam Updated Vital Signs BP (!) 147/71 (BP Location: Right Arm)   Pulse 69   Temp 98.1 F (36.7 C) (Oral)   Resp 16  SpO2 98%   Physical Exam Constitutional:      General: He is not in acute distress.    Appearance: He is well-developed.  HENT:     Head: Normocephalic.  Neck:     Musculoskeletal: Neck supple.  Cardiovascular:     Rate and Rhythm: Normal rate.  Pulmonary:     Effort: Pulmonary effort is normal.     Breath sounds: No wheezing.  Musculoskeletal: Normal range of motion.  Skin:    General: Skin is warm.     Findings: Abscess present.     Comments: 2 cm induration mid suprapubic region. Small open punctum with serosanguinous liquid dc.       ED Treatments / Results  Labs (all labs ordered are listed, but only abnormal results are displayed) Labs Reviewed - No data to display  EKG None  Radiology No  results found.  Procedures Procedures (including critical care time)  INCISION AND DRAINAGE Performed by: Evalee Jefferson Consent: Verbal consent obtained. Risks and benefits: risks, benefits and alternatives were discussed Type: abscess  Body area: suprapubic skin  Anesthesia: topical freeze spray  Incision was made with a scalpel.  Local anesthetic: Gebauers freeze spray  Anesthetic total: na  Complexity: complex Blunt dissection to break up loculations  Drainage: purulent  Drainage amount: small  Packing material: none  Patient tolerance: Patient tolerated the procedure well with no immediate complications.     Medications Ordered in ED Medications  pentafluoroprop-tetrafluoroeth (GEBAUERS) aerosol ( Topical Given 05/22/19 1631)  sulfamethoxazole-trimethoprim (BACTRIM DS) 800-160 MG per tablet 1 tablet (1 tablet Oral Given 05/22/19 1631)     Initial Impression / Assessment and Plan / ED Course  I have reviewed the triage vital signs and the nursing notes.  Pertinent labs & imaging results that were available during my care of the patient were reviewed by me and considered in my medical decision making (see chart for details).        Pt with small abscess suprapubic region. Discussed warm soaks, Bactrim started, first dose here.  Strict return precautions outlined.  Final Clinical Impressions(s) / ED Diagnoses   Final diagnoses:  Abscess    ED Discharge Orders         Ordered    sulfamethoxazole-trimethoprim (BACTRIM DS) 800-160 MG tablet  2 times daily     05/22/19 1624    ibuprofen (ADVIL) 600 MG tablet  Every 6 hours PRN     05/22/19 1624    traMADol (ULTRAM) 50 MG tablet  Every 6 hours PRN     05/22/19 1624           Evalee Jefferson, PA-C 05/23/19 2340    Fredia Sorrow, MD 06/06/19 [phone]

## 2019-11-28 IMAGING — DX DG ANKLE COMPLETE 3+V*R*
3 series · 3 of 3 positions shown · non-contrast
Comparison: None.

CLINICAL DATA: Pain and swelling.

EXAM:
RIGHT ANKLE - COMPLETE 3+ VIEW

[ankle ap]
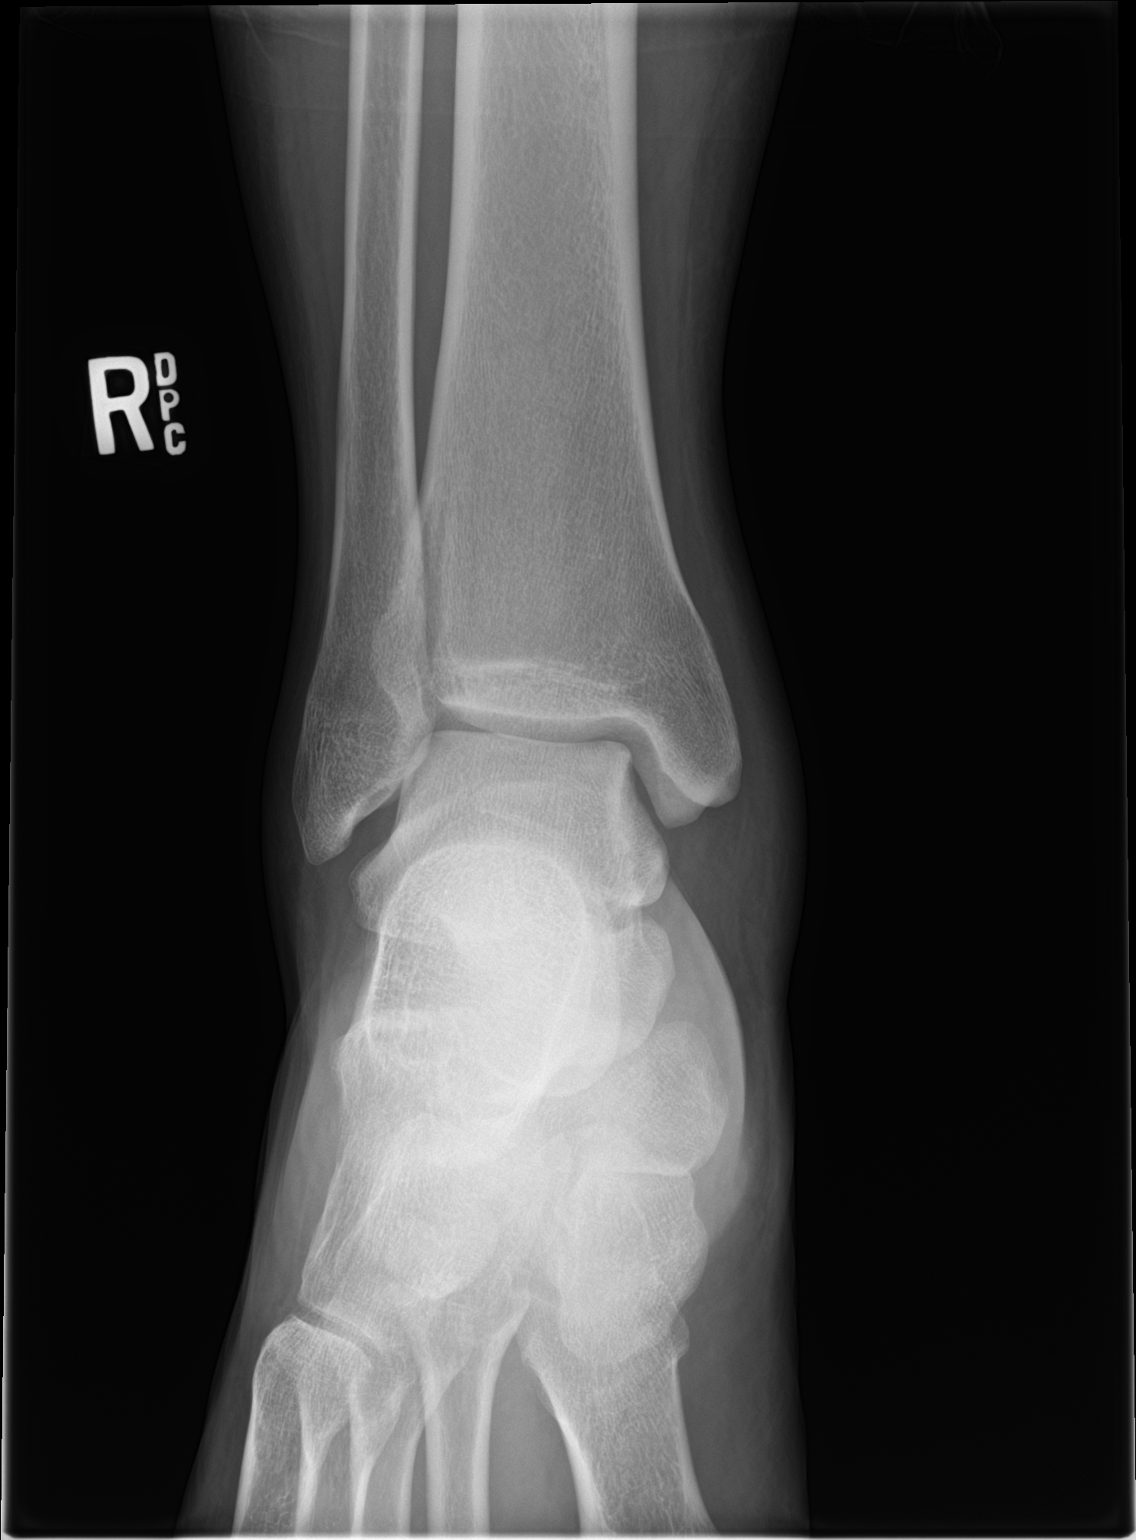

[ankle obl]
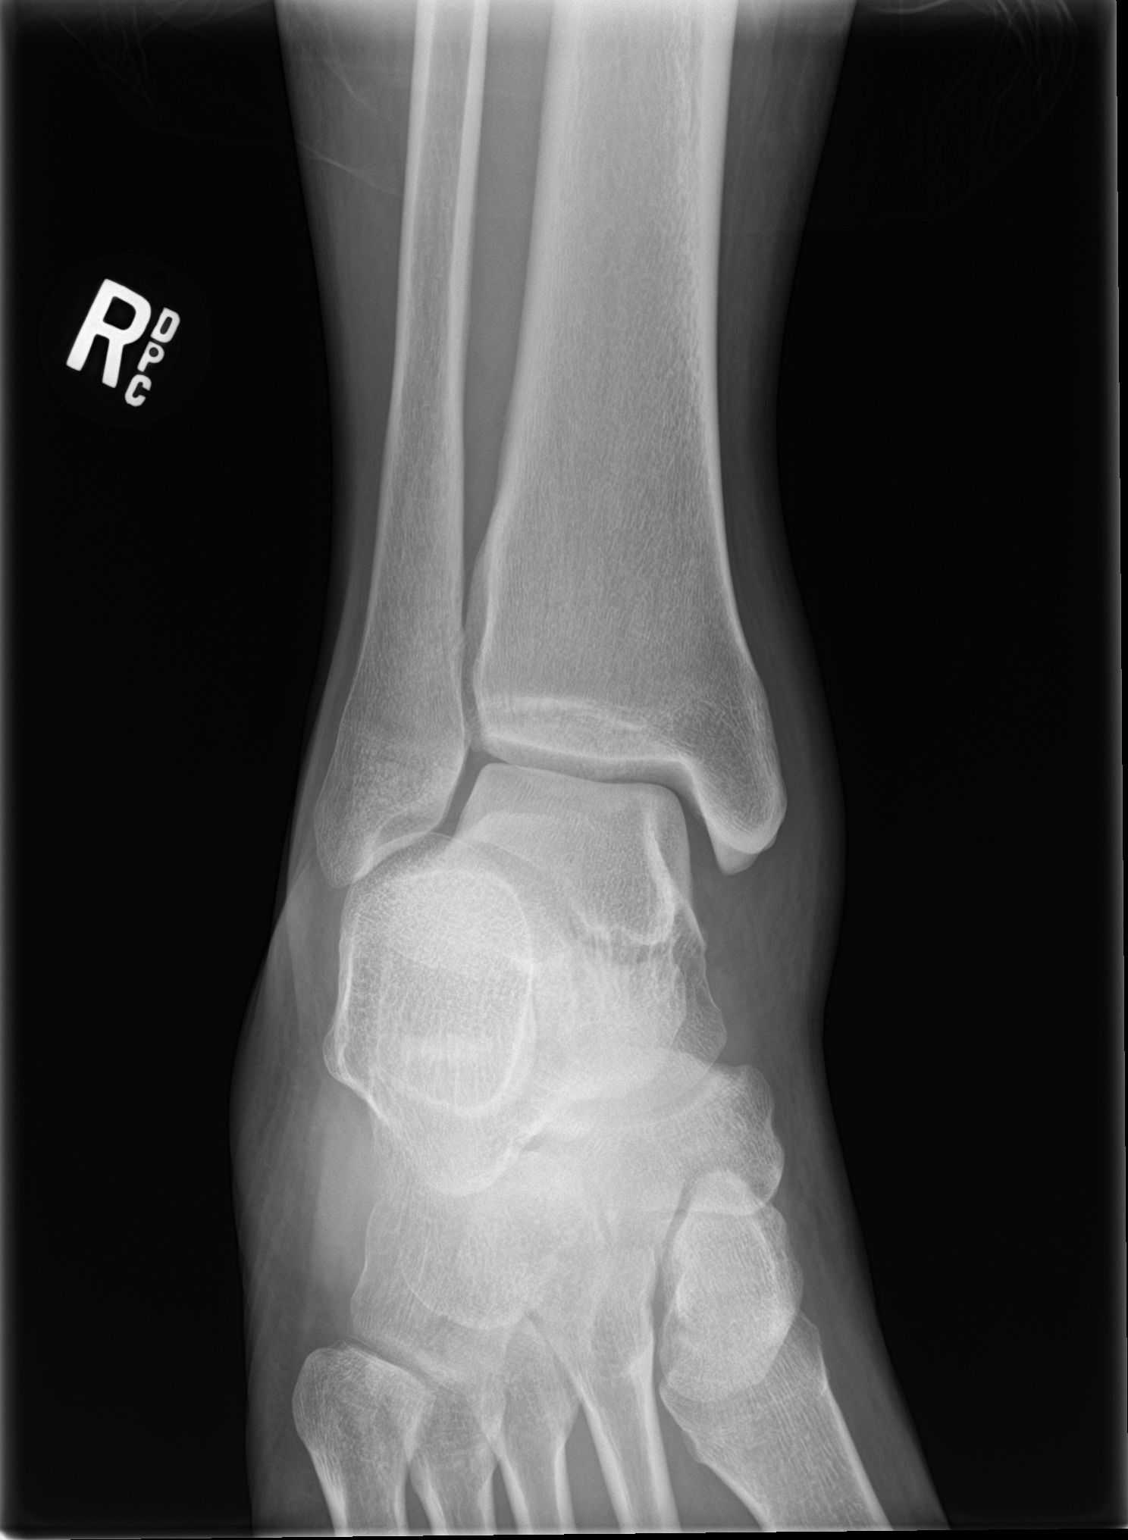

[ankle lat]
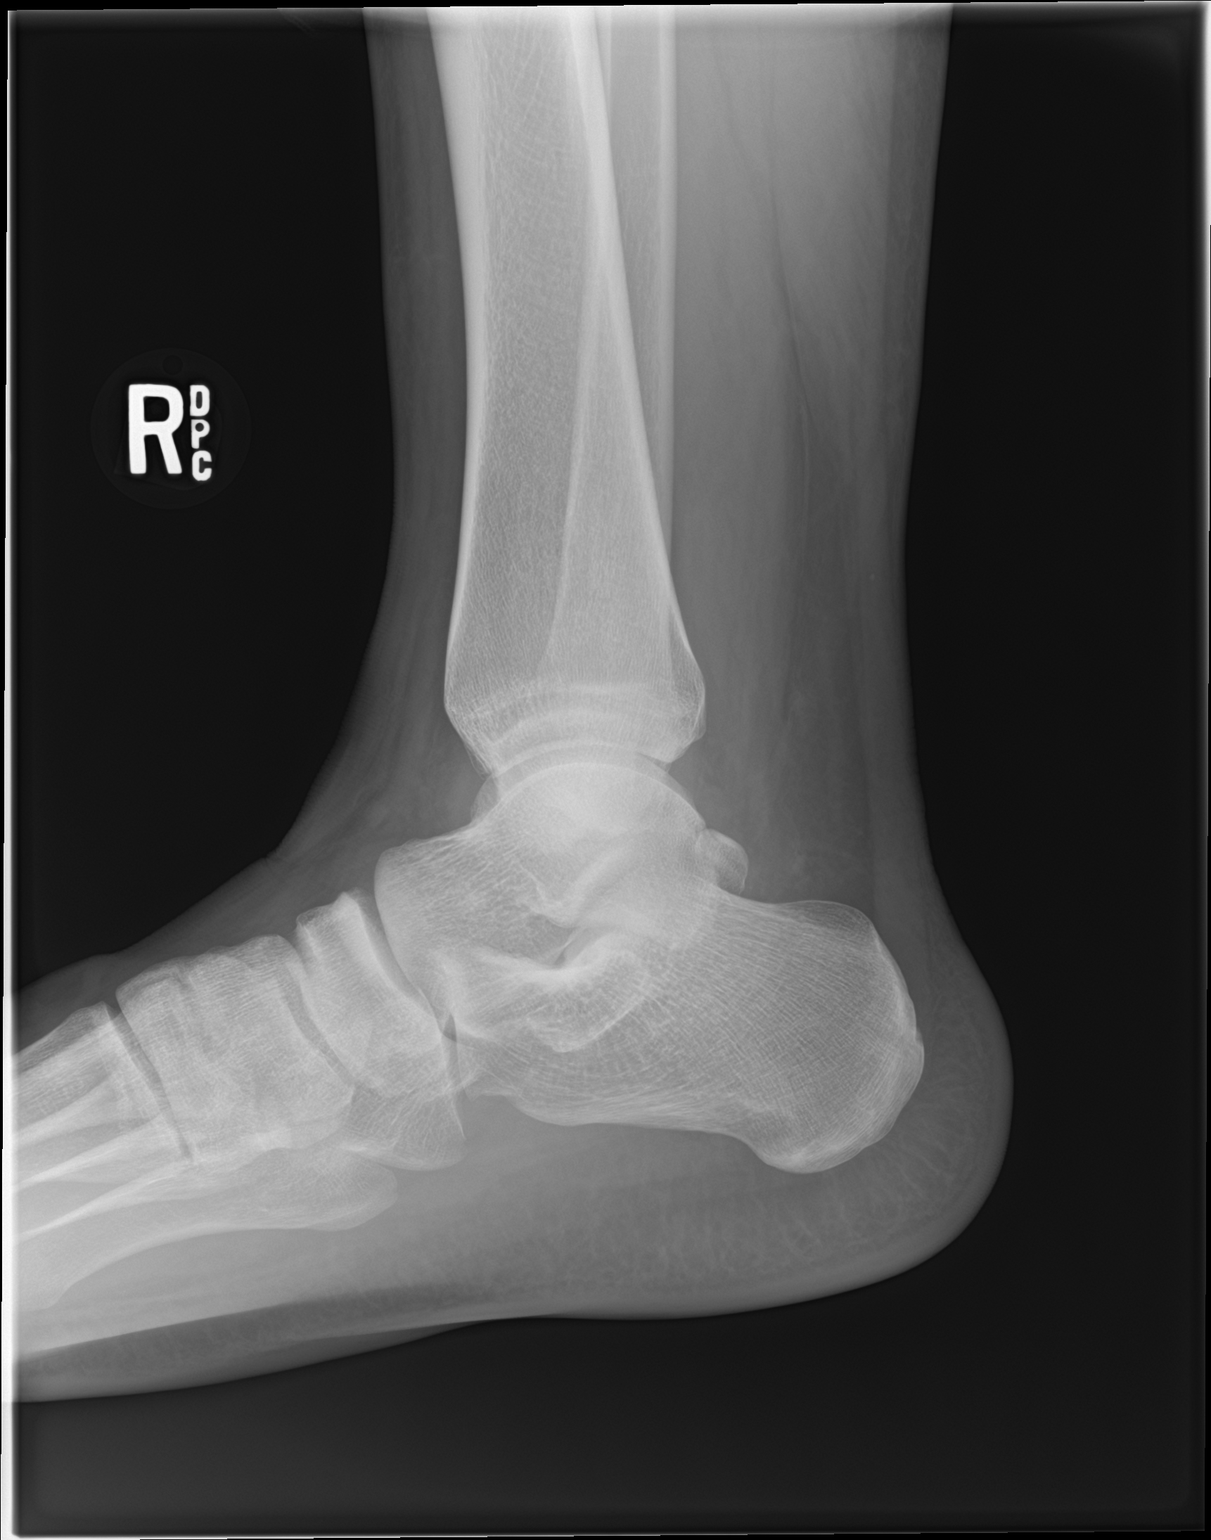

[3 of 3 positions shown; findings below may reference images not displayed]

FINDINGS: There is no evidence of fracture, dislocation, or joint effusion.
There is no evidence of arthropathy or other focal bone abnormality.
Soft tissues are unremarkable.
IMPRESSION: Negative.

## 2020-04-28 IMAGING — DX DG CHEST 2V
2 series · 2 of 2 positions shown · non-contrast
Comparison: 11/18/2011.

CLINICAL DATA: Productive cough with fever, chills and body aches
for 2 days.

EXAM:
CHEST - 2 VIEW

[chest pa]
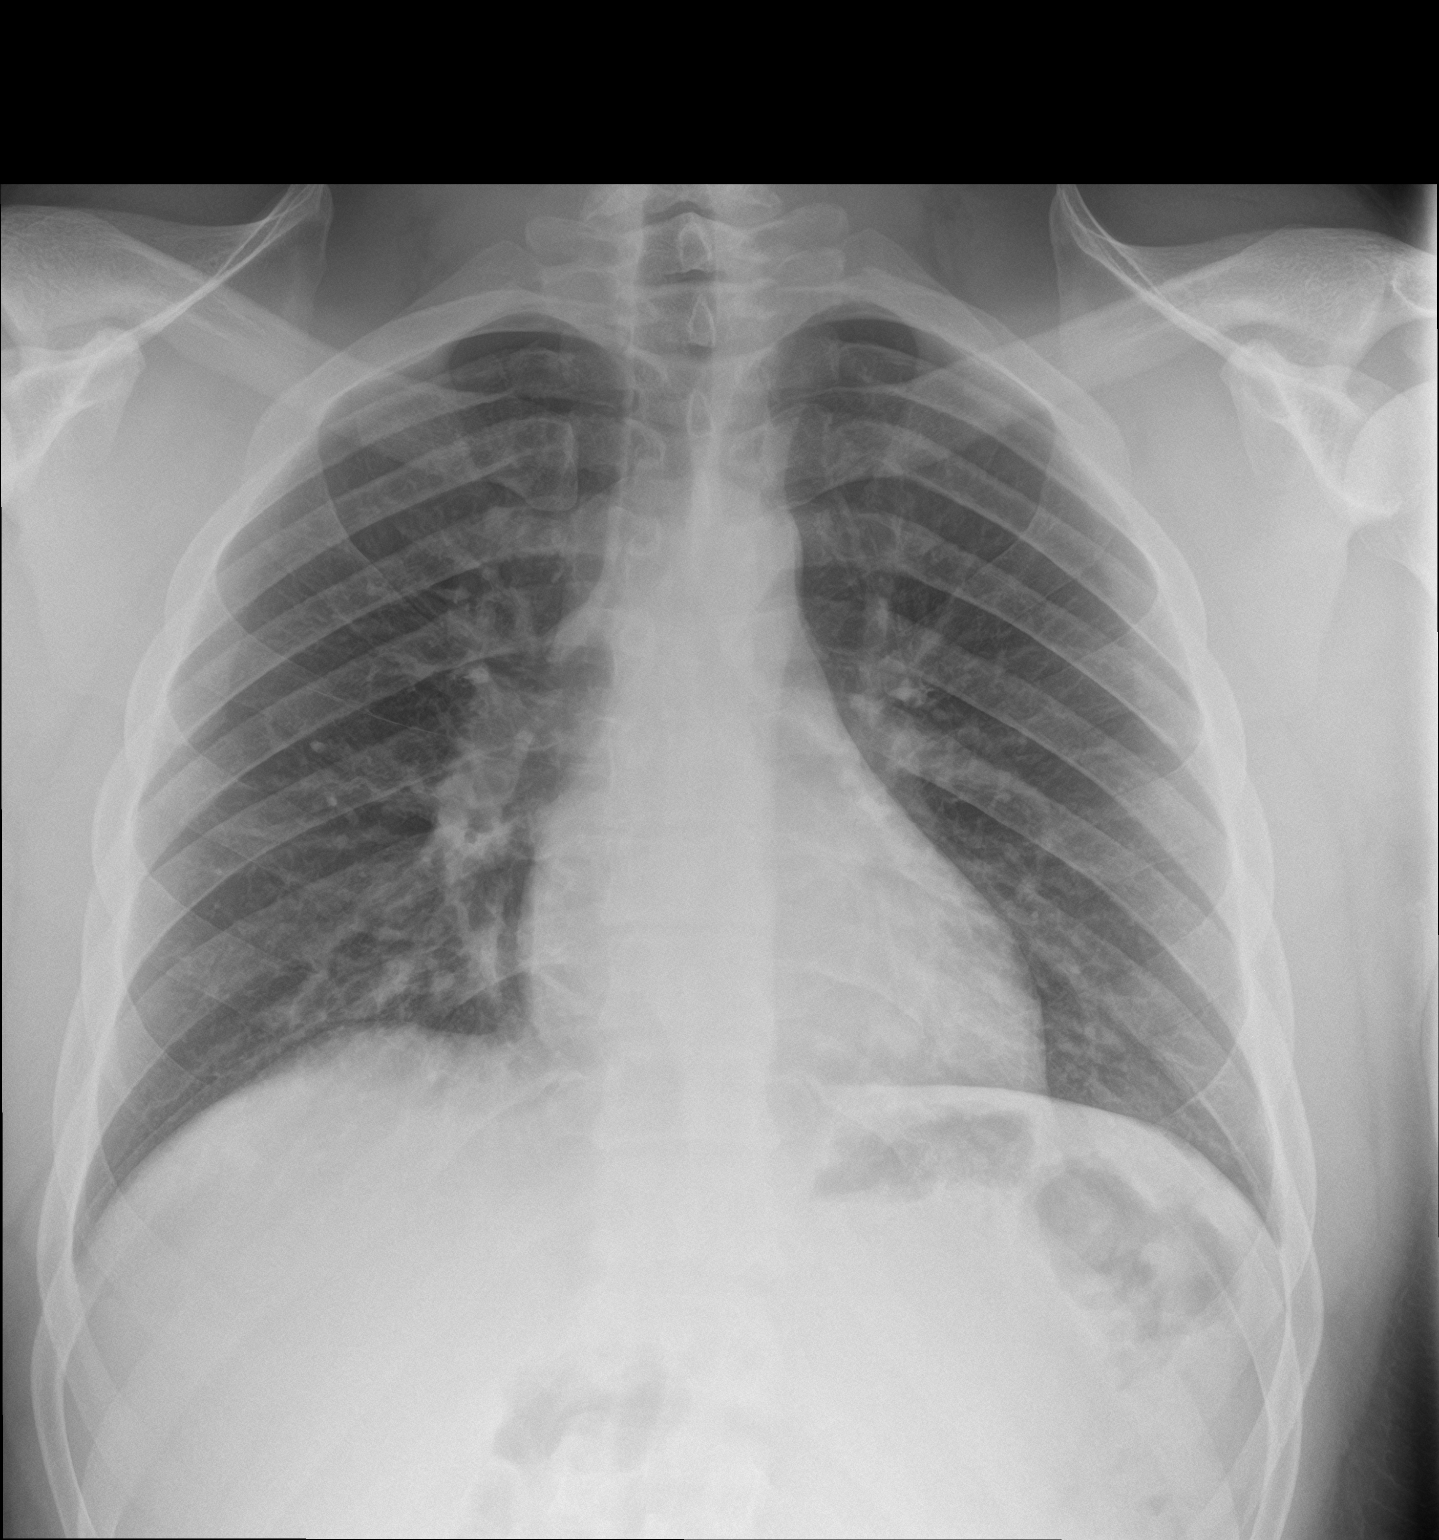

[chest lat]
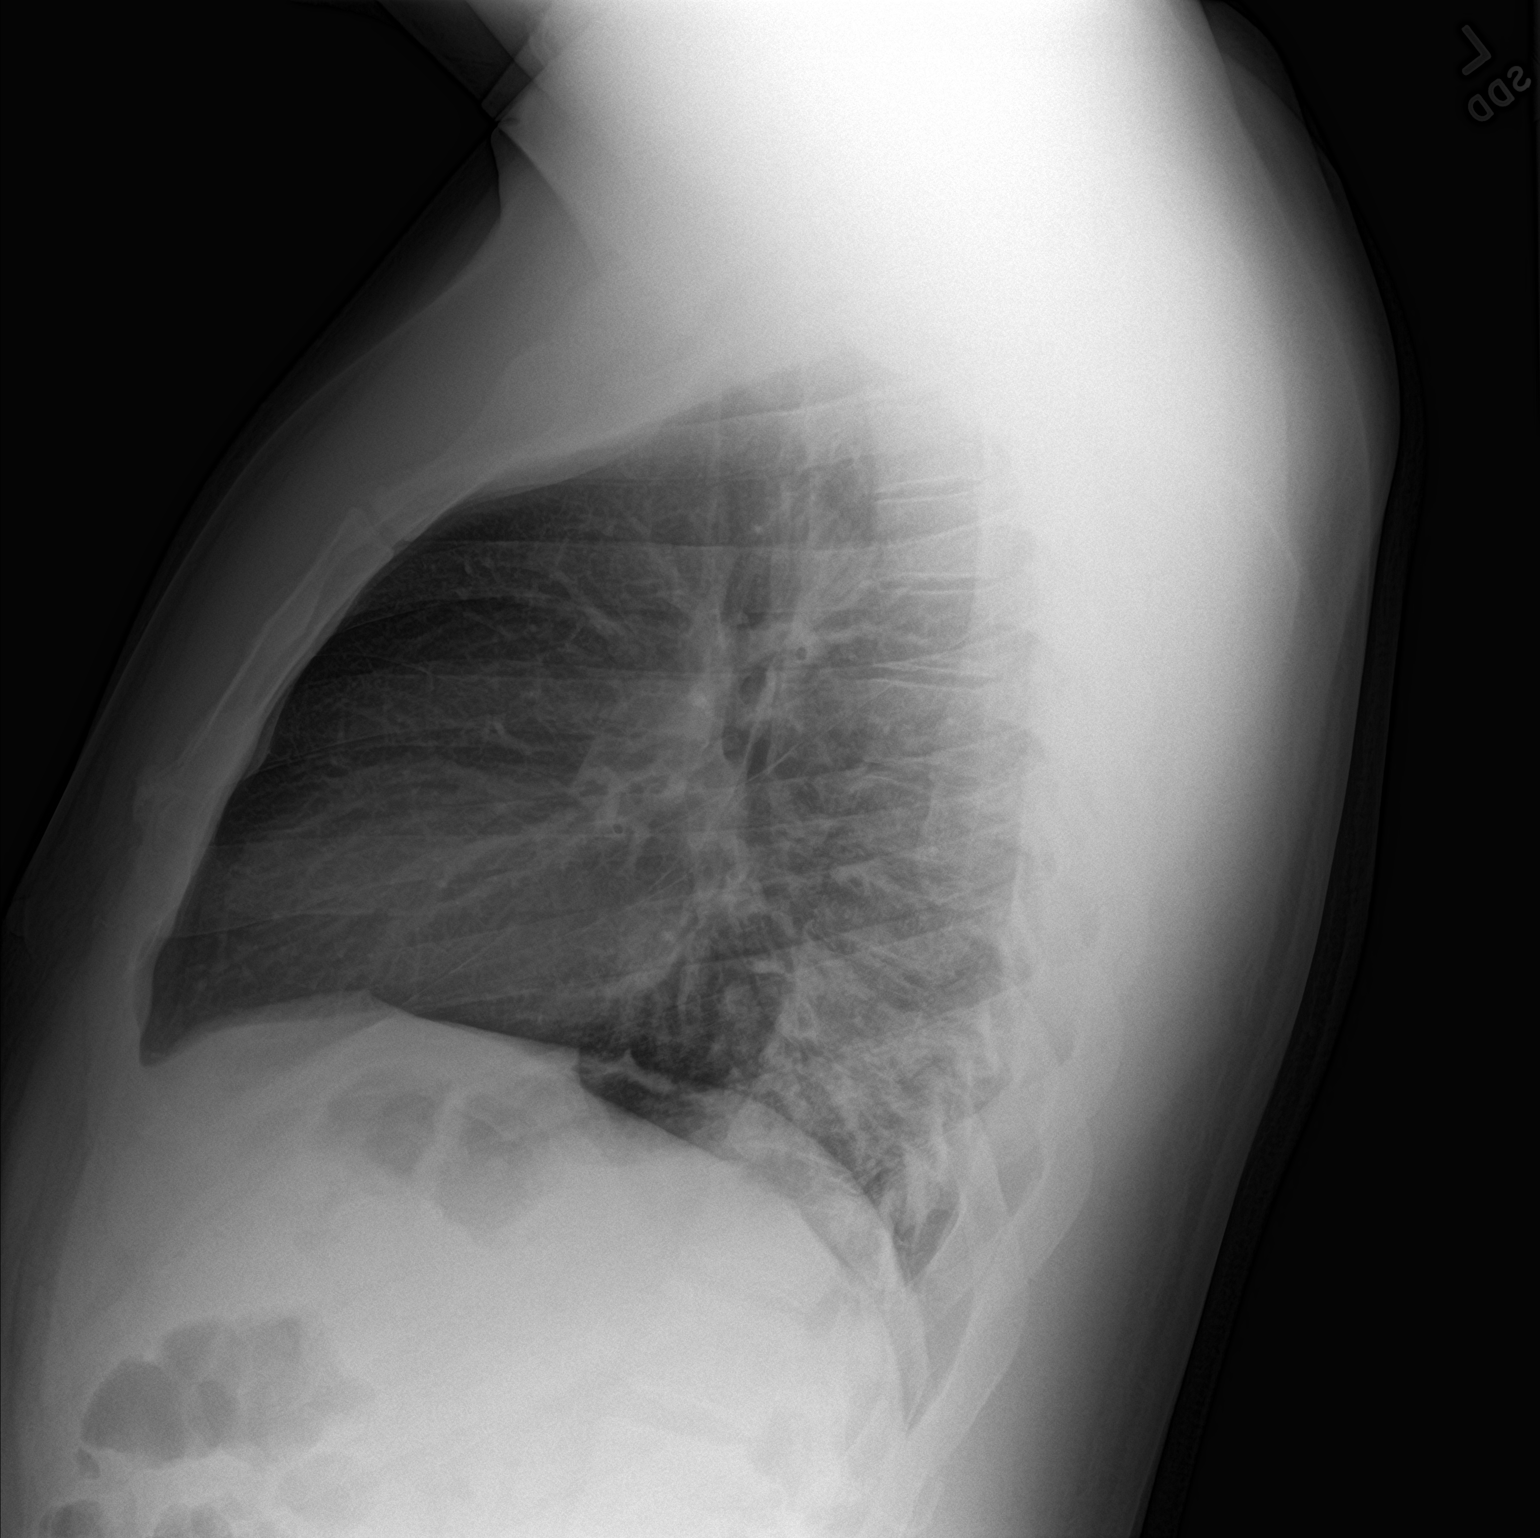

[2 of 2 positions shown; findings below may reference images not displayed]

FINDINGS: The heart size and mediastinal contours are within normal limits.
Both lungs are clear. No pleural effusion or pneumothorax. The
visualized skeletal structures are unremarkable.
IMPRESSION: No active cardiopulmonary disease.
